# Patient Record
Sex: Female | Born: 1980 | Race: Black or African American | Hispanic: No | Marital: Married | Smoking: Never smoker
Health system: Southern US, Community
[De-identification: ages and names within clinical notes are randomized; demographics above are authoritative.]

## PROBLEM LIST (undated history)

## (undated) DIAGNOSIS — J45909 Unspecified asthma, uncomplicated: Secondary | ICD-10-CM

## (undated) DIAGNOSIS — F419 Anxiety disorder, unspecified: Secondary | ICD-10-CM

## (undated) DIAGNOSIS — N63 Unspecified lump in unspecified breast: Secondary | ICD-10-CM

## (undated) DIAGNOSIS — F329 Major depressive disorder, single episode, unspecified: Secondary | ICD-10-CM

## (undated) DIAGNOSIS — D573 Sickle-cell trait: Secondary | ICD-10-CM

## (undated) DIAGNOSIS — I10 Essential (primary) hypertension: Secondary | ICD-10-CM

## (undated) DIAGNOSIS — D72829 Elevated white blood cell count, unspecified: Secondary | ICD-10-CM

## (undated) DIAGNOSIS — L309 Dermatitis, unspecified: Secondary | ICD-10-CM

## (undated) DIAGNOSIS — Z8669 Personal history of other diseases of the nervous system and sense organs: Secondary | ICD-10-CM

## (undated) DIAGNOSIS — F32A Depression, unspecified: Secondary | ICD-10-CM

## (undated) DIAGNOSIS — IMO0001 Reserved for inherently not codable concepts without codable children: Secondary | ICD-10-CM

## (undated) DIAGNOSIS — Z8619 Personal history of other infectious and parasitic diseases: Secondary | ICD-10-CM

## (undated) HISTORY — DX: Essential (primary) hypertension: I10

## (undated) HISTORY — DX: Sickle-cell trait: D57.3

## (undated) HISTORY — DX: Dermatitis, unspecified: L30.9

## (undated) HISTORY — DX: Unspecified lump in unspecified breast: N63.0

## (undated) HISTORY — DX: Depression, unspecified: F32.A

## (undated) HISTORY — DX: Major depressive disorder, single episode, unspecified: F32.9

## (undated) HISTORY — DX: Personal history of other diseases of the nervous system and sense organs: Z86.69

## (undated) HISTORY — DX: Unspecified asthma, uncomplicated: J45.909

## (undated) HISTORY — DX: Anxiety disorder, unspecified: F41.9

## (undated) HISTORY — DX: Personal history of other infectious and parasitic diseases: Z86.19

---

## 2011-09-27 LAB — OB RESULTS CONSOLE RPR: RPR: NONREACTIVE

## 2011-09-27 LAB — OB RESULTS CONSOLE ABO/RH: RH Type: POSITIVE

## 2011-09-27 LAB — OB RESULTS CONSOLE HIV ANTIBODY (ROUTINE TESTING): HIV: NONREACTIVE

## 2011-10-12 NOTE — L&D Delivery Note (Signed)
Delivery Note At 6:31 PM a healthy female was delivered via Vaginal, Spontaneous Delivery (Presentation: Left Occiput Anterior).  APGAR: 9, 9.   Placenta status: Intact, Spontaneous.  Cord: 3 vessels with the following complications: None.  Cord pH: Not done  Anesthesia: Epidural  Episiotomy: None Lacerations: 1st degree Suture Repair: vicryl rapide Est. Blood Loss (mL): 300  Mom to postpartum.  Baby to nursery-stable.  Christia Coaxum,MARIE-LYNE 05/04/2012, 6:53 PM

## 2011-11-24 ENCOUNTER — Inpatient Hospital Stay (HOSPITAL_COMMUNITY): Admission: AD | Admit: 2011-11-24 | Payer: Self-pay | Source: Ambulatory Visit | Admitting: Obstetrics & Gynecology

## 2012-04-27 ENCOUNTER — Other Ambulatory Visit: Payer: Self-pay | Admitting: Obstetrics & Gynecology

## 2012-04-28 ENCOUNTER — Encounter (HOSPITAL_COMMUNITY): Payer: Self-pay | Admitting: *Deleted

## 2012-04-28 ENCOUNTER — Telehealth (HOSPITAL_COMMUNITY): Payer: Self-pay | Admitting: *Deleted

## 2012-04-28 NOTE — Telephone Encounter (Signed)
Preadmission screen  

## 2012-05-03 ENCOUNTER — Inpatient Hospital Stay (HOSPITAL_COMMUNITY)
Admission: AD | Admit: 2012-05-03 | Discharge: 2012-05-06 | DRG: 775 | Disposition: A | Discharge status: Home / Self Care | Payer: 59 | Source: Ambulatory Visit | Attending: Obstetrics and Gynecology | Admitting: Obstetrics and Gynecology

## 2012-05-03 DIAGNOSIS — D72829 Elevated white blood cell count, unspecified: Secondary | ICD-10-CM

## 2012-05-03 DIAGNOSIS — O99892 Other specified diseases and conditions complicating childbirth: Secondary | ICD-10-CM | POA: Diagnosis present

## 2012-05-03 DIAGNOSIS — IMO0001 Reserved for inherently not codable concepts without codable children: Secondary | ICD-10-CM

## 2012-05-03 DIAGNOSIS — Z22330 Carrier of Group B streptococcus: Secondary | ICD-10-CM

## 2012-05-03 HISTORY — DX: Elevated white blood cell count, unspecified: D72.829

## 2012-05-03 HISTORY — DX: Reserved for inherently not codable concepts without codable children: IMO0001

## 2012-05-03 NOTE — MAU Note (Signed)
Pt G1 at 40.3wks leaking clear fluid since 2245 and having contractions.  GBS +

## 2012-05-04 ENCOUNTER — Inpatient Hospital Stay (HOSPITAL_COMMUNITY): Payer: 59 | Admitting: Anesthesiology

## 2012-05-04 ENCOUNTER — Encounter (HOSPITAL_COMMUNITY): Payer: Self-pay | Admitting: Anesthesiology

## 2012-05-04 ENCOUNTER — Inpatient Hospital Stay (HOSPITAL_COMMUNITY): Admission: RE | Admit: 2012-05-04 | Payer: 59 | Source: Ambulatory Visit

## 2012-05-04 ENCOUNTER — Encounter (HOSPITAL_COMMUNITY): Payer: Self-pay | Admitting: *Deleted

## 2012-05-04 DIAGNOSIS — IMO0001 Reserved for inherently not codable concepts without codable children: Secondary | ICD-10-CM

## 2012-05-04 HISTORY — DX: Reserved for inherently not codable concepts without codable children: IMO0001

## 2012-05-04 LAB — CBC
MCV: 82.8 fL (ref 78.0–100.0)
Platelets: 185 10*3/uL (ref 150–400)
RBC: 4.82 MIL/uL (ref 3.87–5.11)
RDW: 20.2 % — ABNORMAL HIGH (ref 11.5–15.5)
WBC: 11.7 10*3/uL — ABNORMAL HIGH (ref 4.0–10.5)

## 2012-05-04 LAB — RPR: RPR Ser Ql: NONREACTIVE

## 2012-05-04 MED ORDER — PENICILLIN G POTASSIUM 5000000 UNITS IJ SOLR
5.0000 10*6.[IU] | Freq: Once | INTRAVENOUS | Status: AC
  Administered 2012-05-04: 5 10*6.[IU] via INTRAVENOUS
  Filled 2012-05-04: qty 5

## 2012-05-04 MED ORDER — ONDANSETRON HCL 4 MG/2ML IJ SOLN: 4.0000 mg | INTRAMUSCULAR | Status: DC | PRN

## 2012-05-04 MED ORDER — ZOLPIDEM TARTRATE 5 MG PO TABS: 5.0000 mg | ORAL_TABLET | Freq: Every evening | ORAL | Status: DC | PRN

## 2012-05-04 MED ORDER — EPHEDRINE 5 MG/ML INJ
10.0000 mg | INTRAVENOUS | Status: DC | PRN
  Filled 2012-05-04: qty 4

## 2012-05-04 MED ORDER — OXYTOCIN 40 UNITS IN LACTATED RINGERS INFUSION - SIMPLE MED
1.0000 m[IU]/min | INTRAVENOUS | Status: DC
  Administered 2012-05-04: 4 m[IU]/min via INTRAVENOUS
  Administered 2012-05-04: 8 m[IU]/min via INTRAVENOUS
  Administered 2012-05-04: 10 m[IU]/min via INTRAVENOUS
  Administered 2012-05-04: 2 m[IU]/min via INTRAVENOUS

## 2012-05-04 MED ORDER — TETANUS-DIPHTH-ACELL PERTUSSIS 5-2.5-18.5 LF-MCG/0.5 IM SUSP
0.5000 mL | Freq: Once | INTRAMUSCULAR | Status: AC
  Administered 2012-05-05: 0.5 mL via INTRAMUSCULAR
  Filled 2012-05-04: qty 0.5

## 2012-05-04 MED ORDER — LIDOCAINE HCL (PF) 1 % IJ SOLN
INTRAMUSCULAR | Status: DC | PRN
  Administered 2012-05-04 (×2): 5 mL

## 2012-05-04 MED ORDER — ONDANSETRON HCL 4 MG/2ML IJ SOLN: 4.0000 mg | Freq: Four times a day (QID) | INTRAMUSCULAR | Status: DC | PRN

## 2012-05-04 MED ORDER — LACTATED RINGERS IV SOLN
INTRAVENOUS | Status: DC
  Administered 2012-05-04 (×3): via INTRAVENOUS

## 2012-05-04 MED ORDER — FLEET ENEMA 7-19 GM/118ML RE ENEM: 1.0000 | ENEMA | RECTAL | Status: DC | PRN

## 2012-05-04 MED ORDER — PHENYLEPHRINE 40 MCG/ML (10ML) SYRINGE FOR IV PUSH (FOR BLOOD PRESSURE SUPPORT): 80.0000 ug | PREFILLED_SYRINGE | INTRAVENOUS | Status: DC | PRN

## 2012-05-04 MED ORDER — ACETAMINOPHEN 325 MG PO TABS: 650.0000 mg | ORAL_TABLET | ORAL | Status: DC | PRN

## 2012-05-04 MED ORDER — PRENATAL MULTIVITAMIN CH
1.0000 | ORAL_TABLET | Freq: Every day | ORAL | Status: DC
  Administered 2012-05-05 – 2012-05-06 (×2): 1 via ORAL
  Filled 2012-05-04 (×2): qty 1

## 2012-05-04 MED ORDER — DIPHENHYDRAMINE HCL 25 MG PO CAPS: 25.0000 mg | ORAL_CAPSULE | Freq: Four times a day (QID) | ORAL | Status: DC | PRN

## 2012-05-04 MED ORDER — LIDOCAINE HCL (PF) 1 % IJ SOLN
30.0000 mL | INTRAMUSCULAR | Status: DC | PRN
  Filled 2012-05-04: qty 30

## 2012-05-04 MED ORDER — OXYTOCIN BOLUS FROM INFUSION
250.0000 mL | Freq: Once | INTRAVENOUS | Status: AC
  Administered 2012-05-04: 250 mL via INTRAVENOUS
  Filled 2012-05-04: qty 500

## 2012-05-04 MED ORDER — TERBUTALINE SULFATE 1 MG/ML IJ SOLN: 0.2500 mg | Freq: Once | INTRAMUSCULAR | Status: DC | PRN

## 2012-05-04 MED ORDER — NALBUPHINE SYRINGE 5 MG/0.5 ML
10.0000 mg | INJECTION | INTRAMUSCULAR | Status: DC | PRN
  Administered 2012-05-04: 10 mg via INTRAVENOUS
  Filled 2012-05-04 (×2): qty 1

## 2012-05-04 MED ORDER — FENTANYL 2.5 MCG/ML BUPIVACAINE 1/10 % EPIDURAL INFUSION (WH - ANES)
14.0000 mL/h | INTRAMUSCULAR | Status: DC
  Administered 2012-05-04 (×3): 14 mL/h via EPIDURAL
  Filled 2012-05-04 (×3): qty 60

## 2012-05-04 MED ORDER — CITRIC ACID-SODIUM CITRATE 334-500 MG/5ML PO SOLN: 30.0000 mL | ORAL | Status: DC | PRN

## 2012-05-04 MED ORDER — SIMETHICONE 80 MG PO CHEW: 80.0000 mg | CHEWABLE_TABLET | ORAL | Status: DC | PRN

## 2012-05-04 MED ORDER — LANOLIN HYDROUS EX OINT: TOPICAL_OINTMENT | CUTANEOUS | Status: DC | PRN

## 2012-05-04 MED ORDER — EPHEDRINE 5 MG/ML INJ: 10.0000 mg | INTRAVENOUS | Status: DC | PRN

## 2012-05-04 MED ORDER — IBUPROFEN 600 MG PO TABS: 600.0000 mg | ORAL_TABLET | Freq: Four times a day (QID) | ORAL | Status: DC | PRN

## 2012-05-04 MED ORDER — IBUPROFEN 600 MG PO TABS
600.0000 mg | ORAL_TABLET | Freq: Four times a day (QID) | ORAL | Status: DC
  Administered 2012-05-04 – 2012-05-06 (×7): 600 mg via ORAL
  Filled 2012-05-04 (×7): qty 1

## 2012-05-04 MED ORDER — OXYTOCIN 40 UNITS IN LACTATED RINGERS INFUSION - SIMPLE MED
62.5000 mL/h | Freq: Once | INTRAVENOUS | Status: DC
  Filled 2012-05-04: qty 1000

## 2012-05-04 MED ORDER — LACTATED RINGERS IV SOLN
500.0000 mL | INTRAVENOUS | Status: DC | PRN
  Administered 2012-05-04: 500 mL via INTRAVENOUS

## 2012-05-04 MED ORDER — PENICILLIN G POTASSIUM 5000000 UNITS IJ SOLR
2.5000 10*6.[IU] | INTRAVENOUS | Status: DC
  Administered 2012-05-04 (×2): 2.5 10*6.[IU] via INTRAVENOUS
  Filled 2012-05-04 (×6): qty 2.5

## 2012-05-04 MED ORDER — LACTATED RINGERS IV SOLN: 500.0000 mL | Freq: Once | INTRAVENOUS | Status: DC

## 2012-05-04 MED ORDER — PHENYLEPHRINE 40 MCG/ML (10ML) SYRINGE FOR IV PUSH (FOR BLOOD PRESSURE SUPPORT)
80.0000 ug | PREFILLED_SYRINGE | INTRAVENOUS | Status: DC | PRN
  Filled 2012-05-04: qty 5

## 2012-05-04 MED ORDER — WITCH HAZEL-GLYCERIN EX PADS: 1.0000 "application " | MEDICATED_PAD | CUTANEOUS | Status: DC | PRN

## 2012-05-04 MED ORDER — OXYCODONE-ACETAMINOPHEN 5-325 MG PO TABS: 1.0000 | ORAL_TABLET | ORAL | Status: DC | PRN

## 2012-05-04 MED ORDER — SENNOSIDES-DOCUSATE SODIUM 8.6-50 MG PO TABS
2.0000 | ORAL_TABLET | Freq: Every day | ORAL | Status: DC
  Administered 2012-05-05: 2 via ORAL

## 2012-05-04 MED ORDER — ONDANSETRON HCL 4 MG PO TABS: 4.0000 mg | ORAL_TABLET | ORAL | Status: DC | PRN

## 2012-05-04 MED ORDER — BENZOCAINE-MENTHOL 20-0.5 % EX AERO: 1.0000 "application " | INHALATION_SPRAY | CUTANEOUS | Status: DC | PRN

## 2012-05-04 MED ORDER — DIBUCAINE 1 % RE OINT: 1.0000 "application " | TOPICAL_OINTMENT | RECTAL | Status: DC | PRN

## 2012-05-04 MED ORDER — DIPHENHYDRAMINE HCL 50 MG/ML IJ SOLN: 12.5000 mg | INTRAMUSCULAR | Status: DC | PRN

## 2012-05-04 NOTE — Progress Notes (Signed)
Pt transferred to rm 146 via wheelchair, fob at bedside

## 2012-05-04 NOTE — Anesthesia Preprocedure Evaluation (Signed)
Anesthesia Evaluation  Patient identified by MRN, date of birth, ID band Patient awake    Reviewed: Allergy & Precautions, H&P , Patient's Chart, lab work & pertinent test results  Airway Mallampati: II TM Distance: >3 FB Neck ROM: full    Dental No notable dental hx.    Pulmonary neg pulmonary ROS, asthma ,  breath sounds clear to auscultation  Pulmonary exam normal       Cardiovascular negative cardio ROS  Rhythm:regular Rate:Normal     Neuro/Psych negative neurological ROS  negative psych ROS   GI/Hepatic negative GI ROS, Neg liver ROS,   Endo/Other  negative endocrine ROS  Renal/GU negative Renal ROS     Musculoskeletal   Abdominal   Peds  Hematology negative hematology ROS (+)   Anesthesia Other Findings   Reproductive/Obstetrics (+) Pregnancy                           Anesthesia Physical Anesthesia Plan  ASA: II  Anesthesia Plan: Epidural   Post-op Pain Management:    Induction:   Airway Management Planned:   Additional Equipment:   Intra-op Plan:   Post-operative Plan:   Informed Consent: I have reviewed the patients History and Physical, chart, labs and discussed the procedure including the risks, benefits and alternatives for the proposed anesthesia with the patient or authorized representative who has indicated his/her understanding and acceptance.     Plan Discussed with:   Anesthesia Plan Comments:         Anesthesia Quick Evaluation  

## 2012-05-04 NOTE — Progress Notes (Signed)
Pt requested SVE to see if she is ready for epidural. Cervix is virtually unchanged.

## 2012-05-04 NOTE — Anesthesia Procedure Notes (Signed)
Epidural Patient location during procedure: OB Start time: 05/04/2012 9:58 AM  Staffing Anesthesiologist: Brayton Caves R Performed by: anesthesiologist   Preanesthetic Checklist Completed: patient identified, site marked, surgical consent, pre-op evaluation, timeout performed, IV checked, risks and benefits discussed and monitors and equipment checked  Epidural Patient position: sitting Prep: site prepped and draped and DuraPrep Patient monitoring: continuous pulse ox and blood pressure Approach: midline Injection technique: LOR air and LOR saline  Needle:  Needle type: Tuohy  Needle gauge: 17 G Needle length: 9 cm Needle insertion depth: 4 cm Catheter type: closed end flexible Catheter size: 19 Gauge Catheter at skin depth: 10 cm Test dose: negative  Assessment Events: blood not aspirated, injection not painful, no injection resistance, negative IV test and no paresthesia  Additional Notes Patient identified.  Risk benefits discussed including failed block, incomplete pain control, headache, nerve damage, paralysis, blood pressure changes, nausea, vomiting, reactions to medication both toxic or allergic, and postpartum back pain.  Patient expressed understanding and wished to proceed.  All questions were answered.  Sterile technique used throughout procedure and epidural site dressed with sterile barrier dressing. No paresthesia or other complications noted.The patient did not experience any signs of intravascular injection such as tinnitus or metallic taste in mouth nor signs of intrathecal spread such as rapid motor block. Please see nursing notes for vital signs.

## 2012-05-04 NOTE — Progress Notes (Signed)
Subjective: Doing well, pain controled with epidural, UCs q36mins  Anesthesia Epidural   Objective: BP 143/98  Pulse 119  Temp 98.3 F (36.8 C) (Oral)  Resp 20  Ht 5\' 2"  (1.575 m)  Wt 62.415 kg (137 lb 9.6 oz)  BMI 25.17 kg/m2  SpO2 99%  LMP 07/25/2011   FHT:  140's BLine, accelerations pos, good variability, variable decelerations when on left side, resolved on right side. UC:   regular, every 3 minutes VE:   Dilation: 5 Effacement (%): 100 Station: 0 Exam by:: Seymour Bars, MD   Assessment / Plan: Augmentation of labor, progressing well F-well-being reassuring.  Fetal Wellbeing:  Category I Pain Control:  Epidural  Anticipated MOD:  NSVD  Janaia Kozel,MARIE-LYNE 05/04/2012, 1:23 PM

## 2012-05-04 NOTE — H&P (Signed)
Madison Huang is a 31 y.o. female presenting for admission 2nd to [facility]. Pt is 40 4/[redacted] weeks gestation and was scheduled for induction 7/25. GBS cx (+)  SS trait. Maternal Medical History:  Reason for admission: Reason for admission: rupture of membranes and contractions.  Contractions: Frequency: irregular.   Perceived severity is mild.    Fetal activity: Perceived fetal activity is normal.    Prenatal Complications - Diabetes: none.    OB History    Grav Para Term Preterm Abortions TAB SAB Ect Mult Living   1              Past Medical History  Diagnosis Date  . Sickle cell trait   . H/O varicella   . Asthma     as a child not since college   History reviewed. No pertinent past surgical history. Family History: family history includes Cancer in her maternal aunt and mother; Diabetes in her father, maternal grandmother, and paternal grandfather; Hypertension in her father; and Stroke in her mother. Social History:  reports that she has never smoked. She has never used smokeless tobacco. She reports that she does not drink alcohol or use illicit drugs.   Prenatal Transfer Tool  Maternal Diabetes: No Genetic Screening: Normal Maternal Ultrasounds/Referrals: Normal Fetal Ultrasounds or other Referrals:  None Maternal Substance Abuse:  No Significant Maternal Medications:  None Significant Maternal Lab Results:  Lab values include: Group B Strep positive, Other: see prenatal record Other Comments:  SS trait  ROS neg  Dilation: 1 Effacement (%): 70 Station: -2;-3 Exam by:: Dr. Cherly Hensen Blood pressure 129/75, pulse 78, temperature 98.9 F (37.2 C), temperature source Oral, resp. rate 18, height 5\' 2"  (1.575 m), weight 62.415 kg (137 lb 9.6 oz), last menstrual period 07/25/2011. Exam Physical Exam  Constitutional: She is oriented to person, place, and time. She appears well-developed and well-nourished.  Eyes: EOM are normal.  Neck: Neck supple.  Cardiovascular: Regular  rhythm.   Respiratory: Effort normal and breath sounds normal.  GI: Soft. Bowel sounds are normal.  Musculoskeletal: Normal range of motion.  Neurological: She is alert and oriented to person, place, and time.  Skin: Skin is warm and dry.  Psychiatric: She has a normal mood and affect.    Prenatal labs: ABO, Rh: O/Positive/-- (12/17 0000) Antibody: Negative (12/17 0000) Rubella: Immune (12/17 0000) RPR: Nonreactive (12/17 0000)  HBsAg: Negative (12/17 0000)  HIV: Non-reactive (12/17 0000)  GBS: Positive (06/19 0000)   Assessment/Plan  Postdates [facility] GBS cx(+)  P) Admit. Routine labs. Analgesic prn. Pitocin augmentation Uday Jantz A 05/04/2012, 4:15 AM

## 2012-05-04 NOTE — Progress Notes (Signed)
Subjective: Doing well, UCs q3 mins,  Anesthesia epidural   Objective: BP 108/77  Pulse 102  Temp 98.1 F (36.7 C) (Oral)  Resp 18  Ht 5\' 2"  (1.575 m)  Wt 62.415 kg (137 lb 9.6 oz)  BMI 25.17 kg/m2  SpO2 99%  LMP 07/25/2011   FHT:  140's with good variability, occasional variable decelerations. UC:   regular, every 3 minutes VE:   Dilation: 7 Effacement (%): 90 Station: 0 Exam by:: Montez Hageman, RN   VE now: 9+ cm/+1 station.  Left occiput ant. With mild asynclitism corrected with digital manipulation.  Assessment / Plan: Augmentation of labor, progressing well  Fetal Wellbeing:  Category I Pain Control:  epidural  Anticipated MOD:  NSVD  Leroy Pettway,MARIE-LYNE 05/04/2012, 3:55 PM

## 2012-05-05 ENCOUNTER — Encounter (HOSPITAL_COMMUNITY): Payer: Self-pay | Admitting: Obstetrics and Gynecology

## 2012-05-05 LAB — CBC
Hemoglobin: 12.6 g/dL (ref 12.0–15.0)
MCH: 27.7 pg (ref 26.0–34.0)
MCHC: 33.2 g/dL (ref 30.0–36.0)

## 2012-05-05 NOTE — Anesthesia Postprocedure Evaluation (Signed)
  Anesthesia Post-op Note  Patient: Madison Huang  Procedure(s) Performed: * No procedures listed *  Patient Location: PACU and Mother/Baby  Anesthesia Type: Epidural  Level of Consciousness: awake, alert  and oriented  Airway and Oxygen Therapy: Patient Spontanous Breathing  Post-op Pain: mild  Post-op Assessment: Post-op Vital signs reviewed  Post-op Vital Signs: Reviewed and stable  Complications: No apparent anesthesia complications

## 2012-05-05 NOTE — Progress Notes (Signed)
Patient ID: Madison Huang, female   DOB: 1981-08-21, 31 y.o.   MRN: [id number] PPD # 1  Subjective: Pt reports feeling well; mod soreness/ Pain controlled with ibuprofen Tolerating po/ Voiding without problems/ No n/v Bleeding is moderate Newborn info:  Information for the patient's newborn:  [name], Ebbert [[id number]]  female  / circ today/ Feeding: breast   Objective:  VS: Blood pressure 96/67, pulse 106, temperature 98 F (36.7 C), temperature source Oral, resp. rate 18.    Basename 05/05/12 0525 05/04/12 0025  WBC 25.6* 11.7*  HGB 12.6 13.3  HCT 38.0 39.9  PLT 181 185    Blood type: --/--/O POS (07/26 0500) Rubella: Immune (12/17 0000)    Physical Exam:  General: A & O x 3  alert, cooperative and no distress CV: Regular rate and rhythm Resp: clear Abdomen: soft, nontender, normal bowel sounds Uterine Fundus: firm, below umbilicus, nontender Perineum: not inspected and mom breast feeding Ext: edema none and Homans sign is negative, no sign of DVT   A/P: PPD # 1/ G1P1001/ S/P:spontaneous vaginal delivery w/ 1st deg repair Doing well Continue routine post partum orders Anticipate D/C home in AM    Demetrius Revel, MSN, [facility] 05/05/2012, 9:46 AM

## 2012-05-06 ENCOUNTER — Encounter (HOSPITAL_COMMUNITY): Payer: Self-pay

## 2012-05-06 DIAGNOSIS — D72829 Elevated white blood cell count, unspecified: Secondary | ICD-10-CM

## 2012-05-06 HISTORY — DX: Elevated white blood cell count, unspecified: D72.829

## 2012-05-06 LAB — CBC
HCT: 32.4 % — ABNORMAL LOW (ref 36.0–46.0)
Hemoglobin: 10.9 g/dL — ABNORMAL LOW (ref 12.0–15.0)
MCH: 28.2 pg (ref 26.0–34.0)
MCHC: 33.6 g/dL (ref 30.0–36.0)
MCV: 83.9 fL (ref 78.0–100.0)
RBC: 3.86 MIL/uL — ABNORMAL LOW (ref 3.87–5.11)

## 2012-05-06 MED ORDER — IBUPROFEN 600 MG PO TABS: 600.0000 mg | ORAL_TABLET | Freq: Four times a day (QID) | ORAL | Status: AC

## 2012-05-06 MED ORDER — BREAST PUMP MISC: 1.0000 [IU] | Status: DC | PRN

## 2012-05-06 NOTE — Progress Notes (Signed)
Post Partum Day #2            Information for the patient's newborn:  Asyria, Kolander [[id number]]  female   / circumcision done Feeding: breast, working w/ [facility]  Subjective: No HA, SOB, CP, F/C, breast symptoms. Pain minimal. Normal vaginal bleeding, no clots.    Denies fever / chills / foul lochia odor, minimal cramping   Objective:  Temp:  [98.1 F (36.7 C)-99.3 F (37.4 C)] 98.1 F (36.7 C) (07/27 0535) Pulse Rate:  [67-94] 67  (07/27 0535) Resp:  [18] 18  (07/27 0535) BP: (96-115)/(67-75) 115/75 mmHg (07/27 0535)  No intake or output data in the 24 hours ending 05/06/12 1024     Basename 05/06/12 1055 05/05/12 0525  WBC 18.8* 25.6*  HGB 10.9* 12.6  HCT 32.4* 38.0  PLT 159 181    Blood type: --/--/O POS (07/26 0500) Rubella: Immune (12/17 0000)    Physical Exam:  General: alert, cooperative and no distress Uterine Fundus: firm, U-1 Lochia: appropriate Perineum: repair intact, edema none DVT Evaluation: Negative Homan's sign. No significant calf/ankle edema.    Assessment/Plan: PPD # 2 / 31 y.o., G1P1001 S/P:spontaneous vaginal   Principal Problem:  *SVD 05/04/12 M Active Problems:  Leukocytosis  No evidence of endometriosis, afebrile (Tmax 99.3), no uterine tenderness, no foul lochia  Rpt CBC, WBC improved to 18    normal postpartum exam  Continue current postpartum care  D/C home w/ infection precautions  Consult Dr. Billy Coast   LOS: 3 days   Arlan Organ, CNM, MSN 05/06/2012, 10:24 AM

## 2012-05-06 NOTE — Discharge Summary (Signed)
Obstetric Discharge Summary Reason for Admission: onset of labor Prenatal Procedures: ultrasound Intrapartum Procedures: spontaneous vaginal delivery Postpartum Procedures: TDaP vaccine Complications-Operative and Postpartum: 1st degree perineal laceration  Results for Madison Huang, Madison Huang (MRN [id number]) as of 05/06/2012 11:23  05/04/2012 00:25 05/05/2012 05:00 05/05/2012 05:25 05/06/2012 10:55  WBC 11.7 (H)  25.6 (H) 18.8 (H)  RBC 4.82  4.55 3.86 (L)  Hemoglobin 13.3  12.6 10.9 (L)  HCT 39.9  38.0 32.4 (L)  MCV 82.8  83.5 83.9  MCH 27.6  27.7 28.2  MCHC 33.3  33.2 33.6  RDW 20.2 (H)  20.4 (H) 20.0 (H)  Platelets 185  181 159   Physical Exam:  General: alert, cooperative and no distress Lochia: appropriate Uterine Fundus: firm Incision: healing well DVT Evaluation: Negative Homan's sign. No significant calf/ankle edema.  Discharge Diagnoses: Term Pregnancy-delivered and Leokocytosis, afebrile, no evidence of endometritis on exam  Discharge Information: Date: 05/06/2012 Activity: pelvic rest Diet: routine Medications: PNV and Ibuprofen Condition: stable Instructions: refer to practice specific booklet Discharge to: home Follow-up Information    Follow up with LAVOIE,MARIE-LYNE, MD. Schedule an appointment as soon as possible for a visit in 6 weeks.   Contact information:   [address] [place] [address] [phone]          Newborn Data: Live born female "Sheria Lang" Birth Weight: 7 lb 15 oz (3600 g) APGAR: 9, 9  Home with mother.  Madison Huang 05/06/2012, 10:47 AM

## 2014-08-12 ENCOUNTER — Encounter (HOSPITAL_COMMUNITY): Payer: Self-pay

## 2016-11-30 ENCOUNTER — Encounter: Payer: Self-pay | Admitting: Obstetrics and Gynecology

## 2017-10-11 DIAGNOSIS — I10 Essential (primary) hypertension: Secondary | ICD-10-CM

## 2017-10-11 HISTORY — DX: Essential (primary) hypertension: I10

## 2018-05-19 DIAGNOSIS — R030 Elevated blood-pressure reading, without diagnosis of hypertension: Secondary | ICD-10-CM | POA: Diagnosis not present

## 2018-05-19 DIAGNOSIS — Z3009 Encounter for other general counseling and advice on contraception: Secondary | ICD-10-CM | POA: Diagnosis not present

## 2018-05-19 DIAGNOSIS — Z8669 Personal history of other diseases of the nervous system and sense organs: Secondary | ICD-10-CM | POA: Diagnosis not present

## 2018-07-19 DIAGNOSIS — Z0000 Encounter for general adult medical examination without abnormal findings: Secondary | ICD-10-CM | POA: Diagnosis not present

## 2018-07-26 DIAGNOSIS — Z0130 Encounter for examination of blood pressure without abnormal findings: Secondary | ICD-10-CM | POA: Diagnosis not present

## 2018-07-27 DIAGNOSIS — Z0130 Encounter for examination of blood pressure without abnormal findings: Secondary | ICD-10-CM | POA: Diagnosis not present

## 2018-07-28 DIAGNOSIS — Z0130 Encounter for examination of blood pressure without abnormal findings: Secondary | ICD-10-CM | POA: Diagnosis not present

## 2018-07-31 DIAGNOSIS — Z0130 Encounter for examination of blood pressure without abnormal findings: Secondary | ICD-10-CM | POA: Diagnosis not present

## 2018-08-03 DIAGNOSIS — Z0130 Encounter for examination of blood pressure without abnormal findings: Secondary | ICD-10-CM | POA: Diagnosis not present

## 2018-08-03 DIAGNOSIS — I10 Essential (primary) hypertension: Secondary | ICD-10-CM | POA: Diagnosis not present

## 2018-08-03 DIAGNOSIS — J309 Allergic rhinitis, unspecified: Secondary | ICD-10-CM | POA: Diagnosis not present

## 2018-08-04 DIAGNOSIS — Z0130 Encounter for examination of blood pressure without abnormal findings: Secondary | ICD-10-CM | POA: Diagnosis not present

## 2018-08-07 DIAGNOSIS — Z0130 Encounter for examination of blood pressure without abnormal findings: Secondary | ICD-10-CM | POA: Diagnosis not present

## 2018-08-08 DIAGNOSIS — Z0130 Encounter for examination of blood pressure without abnormal findings: Secondary | ICD-10-CM | POA: Diagnosis not present

## 2018-08-10 DIAGNOSIS — Z0130 Encounter for examination of blood pressure without abnormal findings: Secondary | ICD-10-CM | POA: Diagnosis not present

## 2018-08-14 DIAGNOSIS — Z0130 Encounter for examination of blood pressure without abnormal findings: Secondary | ICD-10-CM | POA: Diagnosis not present

## 2018-08-21 ENCOUNTER — Encounter: Payer: Self-pay | Admitting: Neurology

## 2018-08-21 ENCOUNTER — Ambulatory Visit: Payer: BLUE CROSS/BLUE SHIELD | Admitting: Neurology

## 2018-08-21 VITALS — BP 126/80 | HR 87 | Ht 63.0 in | Wt 122.0 lb

## 2018-08-21 DIAGNOSIS — R51 Headache with orthostatic component, not elsewhere classified: Secondary | ICD-10-CM

## 2018-08-21 DIAGNOSIS — H53133 Sudden visual loss, bilateral: Secondary | ICD-10-CM | POA: Diagnosis not present

## 2018-08-21 DIAGNOSIS — G43109 Migraine with aura, not intractable, without status migrainosus: Secondary | ICD-10-CM | POA: Diagnosis not present

## 2018-08-21 DIAGNOSIS — G43009 Migraine without aura, not intractable, without status migrainosus: Secondary | ICD-10-CM | POA: Diagnosis not present

## 2018-08-21 DIAGNOSIS — H532 Diplopia: Secondary | ICD-10-CM

## 2018-08-21 MED ORDER — RIZATRIPTAN BENZOATE 10 MG PO TBDP: 10.0000 mg | ORAL_TABLET | ORAL | 11 refills | Status: DC | PRN

## 2018-08-21 MED ORDER — ONDANSETRON 4 MG PO TBDP: 4.0000 mg | ORAL_TABLET | Freq: Three times a day (TID) | ORAL | 3 refills | Status: DC | PRN

## 2018-08-21 NOTE — Progress Notes (Signed)
[facility]    Provider:  Dr Lucia Gaskins Referring Provider: Arlana Lindau, NP Primary Care Physician:  Arlana Lindau, NP  CC:  Migraines  HPI:  Madison Huang is a 37 y.o. female here as requested by Dr. Sherrie Mustache for headaches and vision changes ongoing for 26 years.  Past medical history of elevated blood pressure, eczema, childhood asthma, migraine, depression, anxiety. Patient has had headaches since a teenager, the first time she had an aura she couldn't see anything like a flashlight was in her eye, she had nausea and vomiting. She continued to have migraines but didn't know what it was until college. She loses vision.  She does not always get the head pain after the aura. She does not always have an aura. The pain is behind the eyes, pulsating/pounding/throbbing, +photo/phonophobia, +nausea and vomiting, a dark room helps, movement makes it worse as does position.   She can go 2-3 months and not have a migraine. Aura is in both eyes. But she can have multiple migraines in a week. Can last 24 hours and be moderately severe to severe. Tylenol will help.  The aura is becoming more severe, losing vision, more frequent, vision blurry or loss of vision completely where she can't see part of her vision, blurry to the point she can't drive she has to pull over. Recently more stress, in the setting of stress. She gets aura 1x a week for a few hours. Mother has migraine.  Reviewed notes, labs and imaging from outside physicians, which showed:  Reviewed provider notes Arlana Lindau, patient was seen by her gynecologist due to migraine with aura was discussing contraception.  Patient says she has been afraid to reveal symptoms that she felt she would be labeled as making it up.  Complaining of blurred vision, photophobia, tunneling of vision with scotoma, nausea with occasional vomiting and stabbing pain behind the eyes.  Headaches tend to occur in clusters a few times a week and then may be  only once per month.  Also complaining of fatigue and swishing sound in the ears.  Slight elevation in blood pressure which is occurred previously but no work-up or medications.  Exam was only significant for rapid heart rate otherwise physical normal.  She was referred here for management of her headaches/migraines.  Will request records of her recent labs that were drawn  Review of Systems: Patient complains of symptoms per HPI as well as the following symptoms: Fatigue, blurred vision, eye pain, anemia, feeling hot, increased thirst, palpitations, allergies, headache, dizziness, depression, anxiety, not enough sleep, decreased energy, insomnia, sleepiness. Pertinent negatives and positives per HPI. All others negative.   Social History   Socioeconomic History  . Marital status: Married    Spouse name: Not on file  . Number of children: 1  . Years of education: Not on file  . Highest education level: Some college, no degree  Occupational History    Employer: [employer]  Social Needs  . Financial resource strain: Not on file  . Food insecurity:    Worry: Not on file    Inability: Not on file  . Transportation needs:    Medical: Not on file    Non-medical: Not on file  Tobacco Use  . Smoking status: Never Smoker  . Smokeless tobacco: Never Used  Substance and Sexual Activity  . Alcohol use: No    Comment: quit August 2011  . Drug use: Never  . Sexual activity: Yes    Partners: Male  Birth control/protection: Pill  Lifestyle  . Physical activity:    Days per week: Not on file    Minutes per session: Not on file  . Stress: Not on file  Relationships  . Social connections:    Talks on phone: Not on file    Gets together: Not on file    Attends religious service: Not on file    Active member of club or organization: Not on file    Attends meetings of clubs or organizations: Not on file    Relationship status: Not on file  . Intimate partner violence:    Fear of current or  ex partner: Not on file    Emotionally abused: Not on file    Physically abused: Not on file    Forced sexual activity: Not on file  Other Topics Concern  . Not on file  Social History Narrative   Lives at home with husband and son   Right handed   Caffeine: 2x per month     Family History  Problem Relation Age of Onset  . Diabetes Maternal Grandmother   . Stroke Mother   . Cancer Mother        breast  . Migraines Mother        on preventatives   . Diabetes Father   . Hypertension Father   . Diabetes Paternal Grandfather   . Cancer Maternal Aunt        uterine    Past Medical History:  Diagnosis Date  . Active labor 05/04/2012  . Anxiety   . Asthma    as a child not since college  . Breast lump   . Depression   . Eczema   . H/O migraine with aura   . H/O varicella   . High blood pressure 2019   on Lisinopril  . Leukocytosis 05/06/2012  . Sickle cell trait (HCC)   . SVD 05/04/12 M 05/05/2012    History reviewed. No pertinent surgical history.  Current Outpatient Medications  Medication Sig Dispense Refill  . levocetirizine (XYZAL) 5 MG tablet Take 5 mg by mouth every evening.  0  . lisinopril (PRINIVIL,ZESTRIL) 10 MG tablet Take 10 mg by mouth daily.    . montelukast (SINGULAIR) 10 MG tablet Take 10 mg by mouth at bedtime.   0  . norethindrone (ERRIN) 0.35 MG tablet Take 1 tablet by mouth daily.    . ondansetron (ZOFRAN-ODT) 4 MG disintegrating tablet Take 1 tablet (4 mg total) by mouth every 8 (eight) hours as needed for nausea. 30 tablet 3  . rizatriptan (MAXALT-MLT) 10 MG disintegrating tablet Take 1 tablet (10 mg total) by mouth as needed for migraine. May repeat in 2 hours if needed 9 tablet 11   No current facility-administered medications for this visit.     Allergies as of 08/21/2018  . (No Known Allergies)    Vitals: BP 126/80 (BP Location: Right Arm, Patient Position: Sitting)   Pulse 87   Ht 5\' 3"  (1.6 m)   Wt 122 lb (55.3 kg)   LMP 07/29/2018  (Exact Date)   Breastfeeding? No   BMI 21.61 kg/m  Last Weight:  Wt Readings from Last 1 Encounters:  08/21/18 122 lb (55.3 kg)   Last Height:   Ht Readings from Last 1 Encounters:  08/21/18 5\' 3"  (1.6 m)   Physical exam: Exam: Gen: NAD, conversant, well nourised, well groomed  CV: RRR, no MRG. No Carotid Bruits. No peripheral edema, warm, nontender Eyes: Conjunctivae clear without exudates or hemorrhage  Neuro: Detailed Neurologic Exam  Speech:    Speech is normal; fluent and spontaneous with normal comprehension.  Cognition:    The patient is oriented to person, place, and time;     recent and remote memory intact;     language fluent;     normal attention, concentration,     fund of knowledge Cranial Nerves:    The pupils are equal, round, and reactive to light. The fundi are normal and spontaneous venous pulsations are present. Visual fields are full to finger confrontation. Extraocular movements are intact. Trigeminal sensation is intact and the muscles of mastication are normal. The face is symmetric. The palate elevates in the midline. Hearing intact. Voice is normal. Shoulder shrug is normal. The tongue has normal motion without fasciculations.   Coordination:    Normal finger to nose and heel to shin. Normal rapid alternating movements.   Gait:    Heel-toe and tandem gait are normal.   Motor Observation:    No asymmetry, no atrophy, and no involuntary movements noted. Tone:    Normal muscle tone.    Posture:    Posture is normal. normal erect    Strength:    Strength is V/V in the upper and lower limbs.      Sensation: intact to LT     Reflex Exam:  DTR's:    Deep tendon reflexes in the upper and lower extremities are normal bilaterally.   Toes:    The toes are downgoing bilaterally.   Clonus:    Clonus is absent.       Assessment/Plan:   37 year old with and without migraine however given concerning symptoms needs further  evaluation  MRI of the brain w/wo contrast: MRI brain due to concerning symptoms of vision loss, positional headaches,vision changes  to look for space occupying mass, chiari or intracranial hypertension (pseudotumor), MS, stroke.  Magnesium citrate 400mg  to 600mg  daily as this has evidence in Migraine with aura  Also riboflavin 400mg  daily, Coenzyme Q 10 100mg  three times daily  Acute Management: Rizatriptan. Please take one tablet at the onset of your headache. If it does not improve the symptoms please take one additional tablet. Do not take more then 2 tablets in 24hrs. Do not take use more then 2 to 3 times in a week.  For nausea and migraine: Ondansetron (Zofran)  Orders Placed This Encounter  Procedures  . MR BRAIN W WO CONTRAST   Meds ordered this encounter  Medications  . rizatriptan (MAXALT-MLT) 10 MG disintegrating tablet    Sig: Take 1 tablet (10 mg total) by mouth as needed for migraine. May repeat in 2 hours if needed    Dispense:  9 tablet    Refill:  11  . ondansetron (ZOFRAN-ODT) 4 MG disintegrating tablet    Sig: Take 1 tablet (4 mg total) by mouth every 8 (eight) hours as needed for nausea.    Dispense:  30 tablet    Refill:  3     Discussed: To prevent or relieve headaches, try the following: Cool Compress. Lie down and place a cool compress on your head.  Avoid headache triggers. If certain foods or odors seem to have triggered your migraines in the past, avoid them. A headache diary might help you identify triggers.  Include physical activity in your daily routine. Try a daily walk or other moderate  aerobic exercise.  Manage stress. Find healthy ways to cope with the stressors, such as delegating tasks on your to-do list.  Practice relaxation techniques. Try deep breathing, yoga, massage and visualization.  Eat regularly. Eating regularly scheduled meals and maintaining a healthy diet might help prevent headaches. Also, drink plenty of fluids.  Follow a  regular sleep schedule. Sleep deprivation might contribute to headaches Consider biofeedback. With this mind-body technique, you learn to control certain bodily functions - such as muscle tension, heart rate and blood pressure - to prevent headaches or reduce headache pain.    Proceed to emergency room if you experience new or worsening symptoms or symptoms do not resolve, if you have new neurologic symptoms or if headache is severe, or for any concerning symptom.   Provided education and documentation from American headache Society toolbox including articles on: chronic migraine medication overuse headache, chronic migraines, prevention of migraines, behavioral and other nonpharmacologic treatments for headache.  Cc: Arlana Lindau, NP  Naomie Dean, MD  [facility] [address] [place], [place] [address]  Phone [phone] Fax [phone]

## 2018-08-21 NOTE — Patient Instructions (Addendum)
Magnesium citrate 400mg  to 600mg  daily as this has evidence in Migraine with aura  Also riboflavin 400mg  daily, Coenzyme Q 10 100mg  three times daily  Acute Management: Rizatriptan. Please take one tablet at the onset of your headache. If it does not improve the symptoms please take one additional tablet. Do not take more then 2 tablets in 24hrs. Do not take use more then 2 to 3 times in a week.  For nausea and migraine: Ondansetron (Zofran)  MRI if the brain (we will call you)   Migraine Headache A migraine headache is a very strong throbbing pain on one side or both sides of your head. Migraines can also cause other symptoms. Talk with your doctor about what things may bring on (trigger) your migraine headaches. Follow these instructions at home: Medicines  Take over-the-counter and prescription medicines only as told by your doctor.  Do not drive or use heavy machinery while taking prescription pain medicine.  To prevent or treat constipation while you are taking prescription pain medicine, your doctor may recommend that you: ? Drink enough fluid to keep your pee (urine) clear or pale yellow. ? Take over-the-counter or prescription medicines. ? Eat foods that are high in fiber. These include fresh fruits and vegetables, whole grains, and beans. ? Limit foods that are high in fat and processed sugars. These include fried and sweet foods. Lifestyle  Avoid alcohol.  Do not use any products that contain nicotine or tobacco, such as cigarettes and e-cigarettes. If you need help quitting, ask your doctor.  Get at least 8 hours of sleep every night.  Limit your stress. General instructions   Keep a journal to find out what may bring on your migraines. For example, write down: ? What you eat and drink. ? How much sleep you get. ? Any change in what you eat or drink. ? Any change in your medicines.  If you have a migraine: ? Avoid things that make your symptoms worse, such as  bright lights. ? It may help to lie down in a dark, quiet room. ? Do not drive or use heavy machinery. ? Ask your doctor what activities are safe for you.  Keep all follow-up visits as told by your doctor. This is important. Contact a doctor if:  You get a migraine that is different or worse than your usual migraines. Get help right away if:  Your migraine gets very bad.  You have a fever.  You have a stiff neck.  You have trouble seeing.  Your muscles feel weak or like you cannot control them.  You start to lose your balance a lot.  You start to have trouble walking.  You pass out (faint). This information is not intended to replace advice given to you by your health care provider. Make sure you discuss any questions you have with your health care provider. Document Released: 07/06/2008 Document Revised: 04/16/2016 Document Reviewed: 03/15/2016 Elsevier Interactive Patient Education  2018 [employer]  Ondansetron oral dissolving tablet What is this medicine? ONDANSETRON (on DAN se tron) is used to treat nausea and vomiting caused by chemotherapy. It is also used to prevent or treat nausea and vomiting after surgery. This medicine may be used for other purposes; ask your health care provider or pharmacist if you have questions. COMMON BRAND NAME(S): Zofran ODT What should I tell my health care provider before I take this medicine? They need to know if you have any of these conditions: -heart disease -history of irregular  heartbeat -liver disease -low levels of magnesium or potassium in the blood -an unusual or allergic reaction to ondansetron, granisetron, other medicines, foods, dyes, or preservatives -pregnant or trying to get pregnant -breast-feeding How should I use this medicine? These tablets are made to dissolve in the mouth. Do not try to push the tablet through the foil backing. With dry hands, peel away the foil backing and gently remove the tablet. Place the  tablet in the mouth and allow it to dissolve, then swallow. While you may take these tablets with water, it is not necessary to do so. Talk to your pediatrician regarding the use of this medicine in children. Special care may be needed. Overdosage: If you think you have taken too much of this medicine contact a poison control center or emergency room at once. NOTE: This medicine is only for you. Do not share this medicine with others. What if I miss a dose? If you miss a dose, take it as soon as you can. If it is almost time for your next dose, take only that dose. Do not take double or extra doses. What may interact with this medicine? Do not take this medicine with any of the following medications: -apomorphine -certain medicines for fungal infections like fluconazole, itraconazole, ketoconazole, posaconazole, voriconazole -cisapride -dofetilide -dronedarone -pimozide -thioridazine -ziprasidone This medicine may also interact with the following medications: -carbamazepine -certain medicines for depression, anxiety, or psychotic disturbances -fentanyl -linezolid -MAOIs like Carbex, Eldepryl, Marplan, Nardil, and Parnate -methylene blue (injected into a vein) -other medicines that prolong the QT interval (cause an abnormal heart rhythm) -phenytoin -rifampicin -tramadol This list may not describe all possible interactions. Give your health care provider a list of all the medicines, herbs, non-prescription drugs, or dietary supplements you use. Also tell them if you smoke, drink alcohol, or use illegal drugs. Some items may interact with your medicine. What should I watch for while using this medicine? Check with your doctor or health care professional as soon as you can if you have any sign of an allergic reaction. What side effects may I notice from receiving this medicine? Side effects that you should report to your doctor or health care professional as soon as possible: -allergic  reactions like skin rash, itching or hives, swelling of the face, lips, or tongue -breathing problems -confusion -dizziness -fast or irregular heartbeat -feeling faint or lightheaded, falls -fever and chills -loss of balance or coordination -seizures -sweating -swelling of the hands and feet -tightness in the chest -tremors -unusually weak or tired Side effects that usually do not require medical attention (report to your doctor or health care professional if they continue or are bothersome): -constipation or diarrhea -headache This list may not describe all possible side effects. Call your doctor for medical advice about side effects. You may report side effects to FDA at 1-800-FDA-1088. Where should I keep my medicine? Keep out of the reach of children. Store between 2 and 30 degrees C (36 and 86 degrees F). Throw away any unused medicine after the expiration date. NOTE: This sheet is a summary. It may not cover all possible information. If you have questions about this medicine, talk to your doctor, pharmacist, or health care provider.  2018 Elsevier/Gold Standard (2013-07-04 16:21:52)   Rizatriptan disintegrating tablets What is this medicine? RIZATRIPTAN (rye za TRIP tan) is used to treat migraines with or without aura. An aura is a strange feeling or visual disturbance that warns you of an attack. It is not used to  prevent migraines. This medicine may be used for other purposes; ask your health care provider or pharmacist if you have questions. COMMON BRAND NAME(S): Maxalt-MLT What should I tell my health care provider before I take this medicine? They need to know if you have any of these conditions: -bowel disease or colitis -diabetes -family history of heart disease -fast or irregular heart beat -heart or blood vessel disease, angina (chest pain), or previous heart attack -high blood pressure -high cholesterol -history of stroke, transient ischemic attacks (TIAs or  mini-strokes), or intracranial bleeding -kidney or liver disease -overweight -poor circulation -postmenopausal or surgical removal of uterus and ovaries -an unusual or allergic reaction to rizatriptan, other medicines, foods, dyes, or preservatives -pregnant or trying to get pregnant -breast-feeding How should I use this medicine? Take this medicine by mouth. Follow the directions on the prescription label. This medicine is taken at the first symptoms of a migraine. It is not for everyday use. Leave the tablet in the foil package until you are ready to take it. Do not push the tablet through the blister pack. Peel open the blister pack with dry hands and place the tablet on your tongue. The tablet will dissolve rapidly and be swallowed in your saliva. It is not necessary to drink any water to take this medicine. If your migraine headache returns after one dose, you can take another dose as directed. You must leave at least 2 hours between doses, and do not take more than 30 mg total in 24 hours. If there is no improvement at all after the first dose, do not take a second dose without talking to your doctor or health care professional. Do not take your medicine more often than directed. Talk to your pediatrician regarding the use of this medicine in children. While this drug may be prescribed for children as young as 6 years for selected conditions, precautions do apply. Overdosage: If you think you have taken too much of this medicine contact a poison control center or emergency room at once. NOTE: This medicine is only for you. Do not share this medicine with others. What if I miss a dose? This does not apply; this medicine is not for regular use. What may interact with this medicine? Do not take this medicine with any of the following medicines: -amphetamine, dextroamphetamine or cocaine -dihydroergotamine, ergotamine, ergoloid mesylates, methysergide, or ergot-type medication - do not take  within 24 hours of taking rizatriptan -feverfew -MAOIs like Carbex, Eldepryl, Marplan, Nardil, and Parnate - do not take rizatriptan within 2 weeks of stopping MAOI therapy. -other migraine medicines like almotriptan, eletriptan, naratriptan, sumatriptan, zolmitriptan - do not take within 24 hours of taking rizatriptan -tryptophan This medicine may also interact with the following medications: -medicines for mental depression, anxiety or mood problems -propranolol This list may not describe all possible interactions. Give your health care provider a list of all the medicines, herbs, non-prescription drugs, or dietary supplements you use. Also tell them if you smoke, drink alcohol, or use illegal drugs. Some items may interact with your medicine. What should I watch for while using this medicine? Only take this medicine for a migraine headache. Take it if you get warning symptoms or at the start of a migraine attack. It is not for regular use to prevent migraine attacks. You may get drowsy or dizzy. Do not drive, use machinery, or do anything that needs mental alertness until you know how this medicine affects you. To reduce dizzy or fainting spells, do  not sit or stand up quickly, especially if you are an older patient. Alcohol can increase drowsiness, dizziness and flushing. Avoid alcoholic drinks. Smoking cigarettes may increase the risk of heart-related side effects from using this medicine. If you take migraine medicines for 10 or more days a month, your migraines may get worse. Keep a diary of headache days and medicine use. Contact your healthcare professional if your migraine attacks occur more frequently. What side effects may I notice from receiving this medicine? Side effects that you should report to your doctor or health care professional as soon as possible: -allergic reactions like skin rash, itching or hives, swelling of the face, lips, or tongue -fast, slow, or irregular heart  beat -increased or decreased blood pressure -seizures -severe stomach pain and cramping, bloody diarrhea -signs and symptoms of a blood clot such as breathing problems; changes in vision; chest pain; severe, sudden headache; pain, swelling, warmth in the leg; trouble speaking; sudden numbness or weakness of the face, arm or leg -tingling, pain, or numbness in the face, hands, or feet Side effects that usually do not require medical attention (report to your doctor or health care professional if they continue or are bothersome): -drowsiness -dry mouth -feeling warm, flushing, or redness of the face -headache -muscle cramps, pain -nausea, vomiting -unusually weak or tired This list may not describe all possible side effects. Call your doctor for medical advice about side effects. You may report side effects to FDA at 1-800-FDA-1088. Where should I keep my medicine? Keep out of the reach of children. Store at room temperature between 15 and 30 degrees C (59 and 86 degrees F). Protect from light and moisture. Throw away any unused medicine after the expiration date. NOTE: This sheet is a summary. It may not cover all possible information. If you have questions about this medicine, talk to your doctor, pharmacist, or health care provider.  2018 Elsevier/Gold Standard (2013-05-29 10:17:42)

## 2018-08-22 DIAGNOSIS — G43109 Migraine with aura, not intractable, without status migrainosus: Secondary | ICD-10-CM | POA: Insufficient documentation

## 2018-08-22 DIAGNOSIS — G43009 Migraine without aura, not intractable, without status migrainosus: Secondary | ICD-10-CM | POA: Insufficient documentation

## 2018-08-23 ENCOUNTER — Telehealth: Payer: Self-pay | Admitting: Neurology

## 2018-08-23 NOTE — Telephone Encounter (Signed)
lvm for pt to call back about scheduling mri  [employer] Auth: [id number] (exp. 08/22/18 to 09/20/18)

## 2018-09-04 DIAGNOSIS — F419 Anxiety disorder, unspecified: Secondary | ICD-10-CM | POA: Diagnosis not present

## 2018-09-04 DIAGNOSIS — Z3041 Encounter for surveillance of contraceptive pills: Secondary | ICD-10-CM | POA: Diagnosis not present

## 2018-09-04 DIAGNOSIS — Z3009 Encounter for other general counseling and advice on contraception: Secondary | ICD-10-CM | POA: Diagnosis not present

## 2018-09-29 DIAGNOSIS — Z3202 Encounter for pregnancy test, result negative: Secondary | ICD-10-CM | POA: Diagnosis not present

## 2018-09-29 DIAGNOSIS — Z30430 Encounter for insertion of intrauterine contraceptive device: Secondary | ICD-10-CM | POA: Diagnosis not present

## 2018-10-02 ENCOUNTER — Telehealth: Payer: Self-pay | Admitting: Neurology

## 2018-10-02 NOTE — Telephone Encounter (Signed)
Spoke to the patient she is scheduled at [facility] for 10/24/18 °

## 2018-10-02 NOTE — Telephone Encounter (Signed)
Pt is asking for a call re: scheduling her MRI, please call

## 2018-10-24 ENCOUNTER — Ambulatory Visit: Payer: BLUE CROSS/BLUE SHIELD

## 2018-10-24 DIAGNOSIS — H53133 Sudden visual loss, bilateral: Secondary | ICD-10-CM | POA: Diagnosis not present

## 2018-10-24 DIAGNOSIS — H532 Diplopia: Secondary | ICD-10-CM | POA: Diagnosis not present

## 2018-10-24 DIAGNOSIS — R51 Headache with orthostatic component, not elsewhere classified: Secondary | ICD-10-CM

## 2018-10-24 MED ORDER — GADOBENATE DIMEGLUMINE 529 MG/ML IV SOLN
11.0000 mL | Freq: Once | INTRAVENOUS | Status: AC | PRN
  Administered 2018-10-24: 11 mL via INTRAVENOUS

## 2018-10-27 ENCOUNTER — Telehealth: Payer: Self-pay | Admitting: *Deleted

## 2018-10-27 NOTE — Telephone Encounter (Signed)
-----   Message from Anson Fret, MD sent at 10/26/2018  6:03 PM EST ----- MRI of the brain is unremarkable for age. She has white matter changes that are seen normally in aging and in migraineurs. thanks

## 2018-10-27 NOTE — Telephone Encounter (Signed)
Called pt and discussed MRI brain results including white matter changes that are normally seen in aging and those who have migraines. She verbalized understanding and appreciation and had no questions.

## 2018-10-30 DIAGNOSIS — Z30431 Encounter for routine checking of intrauterine contraceptive device: Secondary | ICD-10-CM | POA: Diagnosis not present

## 2018-11-02 DIAGNOSIS — I10 Essential (primary) hypertension: Secondary | ICD-10-CM | POA: Diagnosis not present

## 2018-11-02 DIAGNOSIS — J309 Allergic rhinitis, unspecified: Secondary | ICD-10-CM | POA: Diagnosis not present

## 2018-11-17 DIAGNOSIS — I10 Essential (primary) hypertension: Secondary | ICD-10-CM | POA: Diagnosis not present

## 2018-12-12 ENCOUNTER — Telehealth: Payer: Self-pay | Admitting: Neurology

## 2018-12-12 ENCOUNTER — Other Ambulatory Visit: Payer: Self-pay | Admitting: *Deleted

## 2018-12-12 MED ORDER — RIZATRIPTAN BENZOATE 10 MG PO TABS: ORAL_TABLET | ORAL | 8 refills | Status: DC

## 2018-12-12 NOTE — Telephone Encounter (Signed)
Order changed to Rizatriptan oral 10 mg tablet #9 with 8 refills per v.o. Dr. Lucia Gaskins.

## 2018-12-12 NOTE — Telephone Encounter (Signed)
Yes olease prescribe

## 2018-12-12 NOTE — Telephone Encounter (Signed)
Patient called in and stated the rizatriptan (MAXALT-MLT) 10 MG disintegrating tablet leaves a nasty taste in her mouth and is wanting to know if she can have something in a pill form

## 2018-12-12 NOTE — Telephone Encounter (Signed)
Called pt on home phone. LVM asking for call back. Also tried pt's cell phone @ [phone]. LVM (ok per DPR) informing pt that Dr. Lucia Gaskins switched her to the Rizatriptan 10 mg tablet that she can swallow. The directions will remain the same as the melt tablet. Take 1 tablet at onset of migraine. Repeat in 2 hours if needed. Max 2 tablets in 24 hours. The prescription was already sent to the pharmacy and her other Maxalt melt tablet has been discontinued. Left office number and asked for call back if she has any questions at all.

## 2019-01-15 DIAGNOSIS — I10 Essential (primary) hypertension: Secondary | ICD-10-CM | POA: Diagnosis not present

## 2019-01-15 DIAGNOSIS — R209 Unspecified disturbances of skin sensation: Secondary | ICD-10-CM | POA: Diagnosis not present

## 2019-01-15 DIAGNOSIS — J301 Allergic rhinitis due to pollen: Secondary | ICD-10-CM | POA: Diagnosis not present

## 2019-01-15 DIAGNOSIS — F411 Generalized anxiety disorder: Secondary | ICD-10-CM | POA: Diagnosis not present

## 2019-02-15 ENCOUNTER — Telehealth: Payer: Self-pay | Admitting: *Deleted

## 2019-02-15 ENCOUNTER — Encounter: Payer: Self-pay | Admitting: *Deleted

## 2019-02-15 NOTE — Telephone Encounter (Signed)
I spoke with the patient and discussed our recommendation to converting appt to a telephone visit d/t lowering the risks associated with the covid19 pandemic. Pt was agreeable to this and requested to r/s to another day. I r/s pt for Wed 5/13 @ 7:30 AM. Pt consented to having the telephone visit filed with insurance. She understands the physical exam will be limited for this visit. She confirmed her name & dob and I updated her chart. No changes to PMH. Meds updated. Pt unsure of recent weight. She verbalized appreciation for the call and understands Dr. Lucia Gaskins will call her for the visit.

## 2019-02-19 ENCOUNTER — Ambulatory Visit: Payer: BLUE CROSS/BLUE SHIELD | Admitting: Neurology

## 2019-02-21 ENCOUNTER — Ambulatory Visit (INDEPENDENT_AMBULATORY_CARE_PROVIDER_SITE_OTHER): Payer: BLUE CROSS/BLUE SHIELD | Admitting: Neurology

## 2019-02-21 ENCOUNTER — Other Ambulatory Visit: Payer: Self-pay

## 2019-02-21 DIAGNOSIS — G43009 Migraine without aura, not intractable, without status migrainosus: Secondary | ICD-10-CM

## 2019-02-21 DIAGNOSIS — G43109 Migraine with aura, not intractable, without status migrainosus: Secondary | ICD-10-CM

## 2019-02-21 MED ORDER — SUMATRIPTAN SUCCINATE 100 MG PO TABS: 100.0000 mg | ORAL_TABLET | Freq: Once | ORAL | 12 refills | Status: AC | PRN

## 2019-02-21 MED ORDER — METOCLOPRAMIDE HCL 10 MG PO TABS
10.0000 mg | ORAL_TABLET | Freq: Three times a day (TID) | ORAL | 6 refills | Status: AC | PRN
Start: 2019-02-21 — End: ?

## 2019-02-21 NOTE — Progress Notes (Signed)
[facility]    Provider:  Dr Lucia GaskinsAhern Referring Provider: Darrow BussingKoirala, [name], MD Primary Care Physician:  Arlana LindauFisher, [name], NP  CC:  Migraines  Virtual Visit via Telephone Note  I connected with Madison Mainsandice S Kempen on 02/21/19 at  7:30 AM EDT by telephone and verified that I am speaking with the correct person using two identifiers.  Location: Patient: home Provider: office   I discussed the limitations, risks, security and privacy concerns of performing an evaluation and management service by telephone and the availability of in person appointments. I also discussed with the patient that there may be a patient responsible charge related to this service. The patient expressed understanding and agreed to proceed.   Follow Up Instructions:    I discussed the assessment and treatment plan with the patient. The patient was provided an opportunity to ask questions and all were answered. The patient agreed with the plan and demonstrated an understanding of the instructions.   The patient was advised to call back or seek an in-person evaluation if the symptoms worsen or if the condition fails to improve as anticipated.  I provided 21 minutes of non-face-to-face time during this encounter.   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 She will go a month or two without a migraine but then she will have clusters. The maxalt works but takes 1.5 hours. She did not like the dissolvable maxalt so we switched to pill form. The same for the zofran. Changing to imitrex and reglan.   Personally reviewed images of MRI brain 10/24/2018 and agree with the following: MRI brain (with and without) demonstrating: - Scattered periventricular and subcortical foci of non-specific T2 hyperintensities. These findings are non-specific and considerations include autoimmune, inflammatory, post-infectious, microvascular ischemic  or migraine associated etiologies.  - No abnormal lesions are seen on post contrast views.  No acute findings.  HPI:  Madison Huang is a 38 y.o. female here as requested by Dr. Docia ChuckKoirala for headaches and vision changes ongoing for 26 years.  Past medical history of elevated blood pressure, eczema, childhood asthma, migraine, depression, anxiety. Patient has had headaches since a teenager, the first time she had an aura she couldn't see anything like a flashlight was in her eye, she had nausea and vomiting. She continued to have migraines but didn't know what it was until college. She loses vision.  She does not always get the head pain after the aura. She does not always have an aura. The pain is behind the eyes, pulsating/pounding/throbbing, +photo/phonophobia, +nausea and vomiting, a dark room helps, movement makes it worse as does position.   She can go 2-3 months and not have a migraine. Aura is in both eyes. But she can have multiple migraines in a week. Can last 24 hours and be moderately severe to severe. Tylenol will help.  The aura is becoming more severe, losing vision, more frequent, vision blurry or loss of vision completely where she can't see part of her vision, blurry to the point she can't drive she has to pull over. Recently more stress, in the setting of stress. She gets aura 1x a week for a few hours. Mother has migraine.  Reviewed notes, labs and imaging from outside physicians, which showed:  Reviewed provider notes Arlana LindauJulie Fisher, patient was seen by her gynecologist due to migraine with aura was discussing contraception.  Patient says she has been afraid to  reveal symptoms that she felt she would be labeled as making it up.  Complaining of blurred vision, photophobia, tunneling of vision with scotoma, nausea with occasional vomiting and stabbing pain behind the eyes.  Headaches tend to occur in clusters a few times a week and then may be only once per month.  Also complaining of  fatigue and swishing sound in the ears.  Slight elevation in blood pressure which is occurred previously but no work-up or medications.  Exam was only significant for rapid heart rate otherwise physical normal.  She was referred here for management of her headaches/migraines.  Will request records of her recent labs that were drawn  Review of Systems: Patient complains of symptoms per HPI as well as the following symptoms: Fatigue, blurred vision, headache, dizziness, depression, anxiety, not enough sleep, decreased energy, insomnia, sleepiness. Pertinent negatives and positives per HPI. All others negative.reviewed with patient.   Social History   Socioeconomic History   Marital status: Married    Spouse name: Not on file   Number of children: 1   Years of education: Not on file   Highest education level: Some college, no degree  Occupational History    Employer: [employer]  Social Network engineer strain: Not on file   Food insecurity:    Worry: Not on file    Inability: Not on file   Transportation needs:    Medical: Not on file    Non-medical: Not on file  Tobacco Use   Smoking status: Never Smoker   Smokeless tobacco: Never Used  Substance and Sexual Activity   Alcohol use: No    Comment: quit August 2011   Drug use: Never   Sexual activity: Yes    Partners: Male    Birth control/protection: I.U.D.  Lifestyle   Physical activity:    Days per week: Not on file    Minutes per session: Not on file   Stress: Not on file  Relationships   Social connections:    Talks on phone: Not on file    Gets together: Not on file    Attends religious service: Not on file    Active member of club or organization: Not on file    Attends meetings of clubs or organizations: Not on file    Relationship status: Not on file   Intimate partner violence:    Fear of current or ex partner: Not on file    Emotionally abused: Not on file    Physically abused: Not on  file    Forced sexual activity: Not on file  Other Topics Concern   Not on file  Social History Narrative   Lives at home with husband and son   Right handed   Caffeine: 2x per month     Family History  Problem Relation Age of Onset   Diabetes Maternal Grandmother    Stroke Mother    Cancer Mother        breast   Migraines Mother        on preventatives    Diabetes Father    Hypertension Father    Diabetes Paternal Grandfather    Cancer Maternal Aunt        uterine    Past Medical History:  Diagnosis Date   Active labor 05/04/2012   Anxiety    Asthma    as a child not since college   Breast lump    Depression    Eczema  H/O migraine with aura    H/O varicella    High blood pressure 2019   on Lisinopril   Leukocytosis 05/06/2012   Sickle cell trait (HCC)    SVD 05/04/12 M 05/05/2012    No past surgical history on file.  Current Outpatient Medications  Medication Sig Dispense Refill   levocetirizine (XYZAL) 5 MG tablet Take 5 mg by mouth every evening.  0   lisinopril (PRINIVIL,ZESTRIL) 10 MG tablet Take 5 mg by mouth daily.      metoCLOPramide (REGLAN) 10 MG tablet Take 1 tablet (10 mg total) by mouth every 8 (eight) hours as needed for nausea. Take this for migraine or nausea. May take this alone or with a triptan ( Sumatriptan) 30 tablet 6   montelukast (SINGULAIR) 10 MG tablet Take 10 mg by mouth at bedtime.   0   SUMAtriptan (IMITREX) 100 MG tablet Take 1 tablet (100 mg total) by mouth once as needed for up to 1 dose. May repeat in 2 hours if headache persists or recurs. 10 tablet 12   UNABLE TO FIND Med Name: Penni Bombard IUD     No current facility-administered medications for this visit.     Allergies as of 02/21/2019   (No Known Allergies)    Vitals: There were no vitals taken for this visit. Last Weight:  Wt Readings from Last 1 Encounters:  08/21/18 122 lb (55.3 kg)   Last Height:   Ht Readings from Last 1 Encounters:    08/21/18  (1.6 m)   Prior exam Physical exam: Exam: Gen: NAD, conversant, well nourised, well groomed                     CV: RRR, no MRG. No Carotid Bruits. No peripheral edema, warm, nontender Eyes: Conjunctivae clear without exudates or hemorrhage  Neuro: Detailed Neurologic Exam  Speech:    Speech is normal; fluent and spontaneous with normal comprehension.  Cognition:    The patient is oriented to person, place, and time;     recent and remote memory intact;     language fluent;     normal attention, concentration,     fund of knowledge Cranial Nerves:    The pupils are equal, round, and reactive to light. The fundi are normal and spontaneous venous pulsations are present. Visual fields are full to finger confrontation. Extraocular movements are intact. Trigeminal sensation is intact and the muscles of mastication are normal. The face is symmetric. The palate elevates in the midline. Hearing intact. Voice is normal. Shoulder shrug is normal. The tongue has normal motion without fasciculations.   Coordination:    Normal finger to nose and heel to shin. Normal rapid alternating movements.   Gait:    Heel-toe and tandem gait are normal.   Motor Observation:    No asymmetry, no atrophy, and no involuntary movements noted. Tone:    Normal muscle tone.    Posture:    Posture is normal. normal erect    Strength:    Strength is V/V in the upper and lower limbs.      Sensation: intact to LT     Reflex Exam:  DTR's:    Deep tendon reflexes in the upper and lower extremities are normal bilaterally.   Toes:    The toes are downgoing bilaterally.   Clonus:    Clonus is absent.       Assessment/Plan:   38 year old with migraines with and without aura.  She will go a month or two without a migraine but then she will have clusters.   MRI of the brain w/wo contrast: unremarkable  - The maxalt works but takes 1.5 hours. She did not like the dissolvable maxalt so we  switched to pill form. The same for the zofran. Changing to imitrex and reglan.   Magnesium citrate 400mg  to 600mg  daily as this has evidence in Migraine with aura  Also riboflavin 400mg  daily, Coenzyme Q 10 100mg  three times daily  Acute Management: Imitrex. Please take one tablet at the onset of your headache. If it does not improve the symptoms please take one additional tablet. Do not take more then 2 tablets in 24hrs. Do not take use more then 2 to 3 times in a week.  For nausea and migraine: Reglan  Meds ordered this encounter  Medications   SUMAtriptan (IMITREX) 100 MG tablet    Sig: Take 1 tablet (100 mg total) by mouth once as needed for up to 1 dose. May repeat in 2 hours if headache persists or recurs.    Dispense:  10 tablet    Refill:  12   metoCLOPramide (REGLAN) 10 MG tablet    Sig: Take 1 tablet (10 mg total) by mouth every 8 (eight) hours as needed for nausea. Take this for migraine or nausea. May take this alone or with a triptan ( Sumatriptan)    Dispense:  30 tablet    Refill:  6   Discussed:  There is increased risk for stroke in women with migraine with aura and a contraindication for the combined contraceptive pill for use by women who have migraine with aura. The risk for women with migraine without aura is lower. However other risk factors like smoking are far more likely to increase stroke risk than migraine. There is a recommendation for no smoking and for the use of OCPs without estrogen such as progestogen only pills particularly for women with migraine with aura.Marland Kitchen People who have migraine headaches with auras may be 3 times more likely to have a stroke caused by a blood clot, compared to migraine patients who don't see auras. Women who take hormone-replacement therapy may be 30 percent more likely to suffer a clot-based stroke than women not taking medication containing estrogen. Other risk factors like smoking and high blood pressure may be  much more  important."  Discussed: To prevent or relieve headaches, try the following: Cool Compress. Lie down and place a cool compress on your head.  Avoid headache triggers. If certain foods or odors seem to have triggered your migraines in the past, avoid them. A headache diary might help you identify triggers.  Include physical activity in your daily routine. Try a daily walk or other moderate aerobic exercise.  Manage stress. Find healthy ways to cope with the stressors, such as delegating tasks on your to-do list.  Practice relaxation techniques. Try deep breathing, yoga, massage and visualization.  Eat regularly. Eating regularly scheduled meals and maintaining a healthy diet might help prevent headaches. Also, drink plenty of fluids.  Follow a regular sleep schedule. Sleep deprivation might contribute to headaches Consider biofeedback. With this mind-body technique, you learn to control certain bodily functions -- such as muscle tension, heart rate and blood pressure -- to prevent headaches or reduce headache pain.    Proceed to emergency room if you experience new or worsening symptoms or symptoms do not resolve, if you have new neurologic symptoms or if headache is severe, or  for any concerning symptom.   Provided education and documentation from American headache Society toolbox including articles on: chronic migraine medication overuse headache, chronic migraines, prevention of migraines, behavioral and other nonpharmacologic treatments for headache.  Cc: Arlana Lindau, NP  Naomie Dean, MD  [facility] [address] [place], [place] [address]  Phone [phone] Fax [phone]

## 2019-03-21 DIAGNOSIS — I10 Essential (primary) hypertension: Secondary | ICD-10-CM | POA: Diagnosis not present

## 2019-06-12 DIAGNOSIS — N760 Acute vaginitis: Secondary | ICD-10-CM | POA: Diagnosis not present

## 2019-06-12 DIAGNOSIS — Z1231 Encounter for screening mammogram for malignant neoplasm of breast: Secondary | ICD-10-CM | POA: Diagnosis not present

## 2019-06-12 DIAGNOSIS — Z6821 Body mass index (BMI) 21.0-21.9, adult: Secondary | ICD-10-CM | POA: Diagnosis not present

## 2019-06-12 DIAGNOSIS — Z1151 Encounter for screening for human papillomavirus (HPV): Secondary | ICD-10-CM | POA: Diagnosis not present

## 2019-06-12 DIAGNOSIS — Z01419 Encounter for gynecological examination (general) (routine) without abnormal findings: Secondary | ICD-10-CM | POA: Diagnosis not present

## 2019-08-01 DIAGNOSIS — B373 Candidiasis of vulva and vagina: Secondary | ICD-10-CM | POA: Diagnosis not present

## 2019-08-01 DIAGNOSIS — Z1159 Encounter for screening for other viral diseases: Secondary | ICD-10-CM | POA: Diagnosis not present

## 2019-08-01 DIAGNOSIS — Z118 Encounter for screening for other infectious and parasitic diseases: Secondary | ICD-10-CM | POA: Diagnosis not present

## 2019-08-01 DIAGNOSIS — Z114 Encounter for screening for human immunodeficiency virus [HIV]: Secondary | ICD-10-CM | POA: Diagnosis not present

## 2019-08-01 DIAGNOSIS — R350 Frequency of micturition: Secondary | ICD-10-CM | POA: Diagnosis not present

## 2019-08-01 DIAGNOSIS — Z113 Encounter for screening for infections with a predominantly sexual mode of transmission: Secondary | ICD-10-CM | POA: Diagnosis not present

## 2019-08-16 DIAGNOSIS — F430 Acute stress reaction: Secondary | ICD-10-CM | POA: Diagnosis not present

## 2019-08-16 DIAGNOSIS — F321 Major depressive disorder, single episode, moderate: Secondary | ICD-10-CM | POA: Diagnosis not present

## 2019-08-22 DIAGNOSIS — F321 Major depressive disorder, single episode, moderate: Secondary | ICD-10-CM | POA: Diagnosis not present

## 2019-08-22 DIAGNOSIS — F411 Generalized anxiety disorder: Secondary | ICD-10-CM | POA: Diagnosis not present

## 2019-11-10 ENCOUNTER — Emergency Department (HOSPITAL_COMMUNITY)
Admission: EM | Admit: 2019-11-10 | Discharge: 2019-11-11 | Disposition: A | Discharge status: Home / Self Care | Payer: BC Managed Care – PPO | Attending: Emergency Medicine | Admitting: Emergency Medicine

## 2019-11-10 DIAGNOSIS — Z79899 Other long term (current) drug therapy: Secondary | ICD-10-CM | POA: Insufficient documentation

## 2019-11-10 DIAGNOSIS — R0789 Other chest pain: Secondary | ICD-10-CM | POA: Diagnosis not present

## 2019-11-10 DIAGNOSIS — R079 Chest pain, unspecified: Secondary | ICD-10-CM | POA: Diagnosis not present

## 2019-11-10 DIAGNOSIS — I10 Essential (primary) hypertension: Secondary | ICD-10-CM | POA: Diagnosis not present

## 2019-11-10 DIAGNOSIS — R072 Precordial pain: Secondary | ICD-10-CM

## 2019-11-11 ENCOUNTER — Emergency Department (HOSPITAL_COMMUNITY): Payer: BC Managed Care – PPO

## 2019-11-11 ENCOUNTER — Other Ambulatory Visit: Payer: Self-pay

## 2019-11-11 ENCOUNTER — Encounter (HOSPITAL_COMMUNITY): Payer: Self-pay | Admitting: *Deleted

## 2019-11-11 DIAGNOSIS — R0602 Shortness of breath: Secondary | ICD-10-CM | POA: Diagnosis not present

## 2019-11-11 DIAGNOSIS — R072 Precordial pain: Secondary | ICD-10-CM | POA: Diagnosis not present

## 2019-11-11 DIAGNOSIS — R079 Chest pain, unspecified: Secondary | ICD-10-CM | POA: Diagnosis not present

## 2019-11-11 DIAGNOSIS — D72829 Elevated white blood cell count, unspecified: Secondary | ICD-10-CM | POA: Diagnosis not present

## 2019-11-11 LAB — BASIC METABOLIC PANEL
Anion gap: 11 (ref 5–15)
BUN: 13 mg/dL (ref 6–20)
CO2: 21 mmol/L — ABNORMAL LOW (ref 22–32)
Calcium: 9.1 mg/dL (ref 8.9–10.3)
Chloride: 106 mmol/L (ref 98–111)
Creatinine, Ser: 0.91 mg/dL (ref 0.44–1.00)
GFR calc Af Amer: >60 mL/min (ref >60)
GFR calc non Af Amer: >60 mL/min (ref >60)
Glucose, Bld: 117 mg/dL — ABNORMAL HIGH (ref 70–99)
Potassium: 3.6 mmol/L (ref 3.5–5.1)
Sodium: 138 mmol/L (ref 135–145)

## 2019-11-11 LAB — TROPONIN I (HIGH SENSITIVITY)
Troponin I (High Sensitivity): <2 ng/L (ref <18)
Troponin I (High Sensitivity): <2 ng/L (ref <18)

## 2019-11-11 LAB — CBC
HCT: 36.6 % (ref 36.0–46.0)
Hemoglobin: 12.4 g/dL (ref 12.0–15.0)
MCH: 29.3 pg (ref 26.0–34.0)
MCHC: 33.9 g/dL (ref 30.0–36.0)
MCV: 86.5 fL (ref 80.0–100.0)
Platelets: 243 10*3/uL (ref 150–400)
RBC: 4.23 MIL/uL (ref 3.87–5.11)
RDW: 13.9 % (ref 11.5–15.5)
WBC: 7.4 10*3/uL (ref 4.0–10.5)
nRBC: 0 % (ref 0.0–0.2)

## 2019-11-11 LAB — I-STAT BETA HCG BLOOD, ED (MC, WL, AP ONLY): I-stat hCG, quantitative: <5 m[IU]/mL (ref <5)

## 2019-11-11 MED ORDER — SODIUM CHLORIDE 0.9% FLUSH: 3.0000 mL | Freq: Once | INTRAVENOUS | Status: DC

## 2019-11-11 MED ORDER — OMEPRAZOLE 20 MG PO CPDR: 20.0000 mg | DELAYED_RELEASE_CAPSULE | Freq: Every day | ORAL | 0 refills | Status: AC

## 2019-11-11 MED ORDER — ALUM & MAG HYDROXIDE-SIMETH 200-200-20 MG/5ML PO SUSP
30.0000 mL | Freq: Once | ORAL | Status: AC
  Administered 2019-11-11: 02:00:00 30 mL via ORAL
  Filled 2019-11-11: qty 30

## 2019-11-11 NOTE — ED Provider Notes (Signed)
[facility] EMERGENCY DEPARTMENT Provider Note   CSN: [id number] Arrival date & time: 11/10/19  2351     History Chief Complaint  Patient presents with  . Chest Pain    Madison Huang is a 39 y.o. female.  The history is provided by the patient.  Chest Pain Pain location:  Substernal area (radiates in straight line up to the throat) Pain quality: dull   Pain severity:  Moderate Onset quality:  Sudden Timing:  Constant Progression:  Unchanged Chronicity:  New Context: eating   Context comment:  Broccoli and salmon and cookies Relieved by:  Nothing Worsened by:  Nothing Ineffective treatments:  None tried Associated symptoms: no abdominal pain, no AICD problem, no altered mental status, no anorexia, no anxiety, no back pain, no claudication, no cough, no diaphoresis, no dizziness, no dysphagia, no fatigue, no fever, no headache, no heartburn, no lower extremity edema, no nausea, no near-syncope, no numbness, no orthopnea, no palpitations, no PND, no shortness of breath, no syncope, no vomiting and no weakness   Risk factors: no birth control, not pregnant and no prior DVT/PE        Past Medical History:  Diagnosis Date  . Active labor 05/04/2012  . Anxiety   . Asthma    as a child not since college  . Breast lump   . Depression   . Eczema   . H/O migraine with aura   . H/O varicella   . High blood pressure 2019   on Lisinopril  . Leukocytosis 05/06/2012  . Sickle cell trait (HCC)   . SVD 05/04/12 M 05/05/2012    Patient Active Problem List   Diagnosis Date Noted  . Migraine with aura and without status migrainosus, not intractable 08/22/2018  . Migraine without aura and without status migrainosus, not intractable 08/22/2018  . Leukocytosis 05/06/2012  . SVD 05/04/12 M 05/05/2012    History reviewed. No pertinent surgical history.   OB History    Gravida  1   Para  1   Term  1   Preterm  0   AB  0   Living  1     SAB  0   TAB  0   Ectopic  0   Multiple  0   Live Births  1           Family History  Problem Relation Age of Onset  . Diabetes Maternal Grandmother   . Stroke Mother   . Cancer Mother        breast  . Migraines Mother        on preventatives   . Diabetes Father   . Hypertension Father   . Diabetes Paternal Grandfather   . Cancer Maternal Aunt        uterine    Social History   Tobacco Use  . Smoking status: Never Smoker  . Smokeless tobacco: Never Used  Substance Use Topics  . Alcohol use: No    Comment: quit August 2011  . Drug use: Never    Home Medications Prior to Admission medications   Medication Sig Start Date End Date Taking? Authorizing Provider  levocetirizine (XYZAL) 5 MG tablet Take 5 mg by mouth every evening. 08/03/18   [provider]  lisinopril (PRINIVIL,ZESTRIL) 10 MG tablet Take 5 mg by mouth daily.     [provider]  metoCLOPramide (REGLAN) 10 MG tablet Take 1 tablet (10 mg total) by mouth every 8 (eight) hours as  needed for nausea. Take this for migraine or nausea. May take this alone or with a triptan ( Sumatriptan) 02/21/19   Anson Fret, MD  montelukast (SINGULAIR) 10 MG tablet Take 10 mg by mouth at bedtime.  08/03/18   [provider]  SUMAtriptan (IMITREX) 100 MG tablet Take 1 tablet (100 mg total) by mouth once as needed for up to 1 dose. May repeat in 2 hours if headache persists or recurs. 02/21/19   Anson Fret, MD  UNABLE TO FIND Med Name: Penni Bombard IUD    [provider]    Allergies    Patient has no known allergies.  Review of Systems   Review of Systems  Constitutional: Negative for diaphoresis, fatigue and fever.  HENT: Negative for trouble swallowing.   Eyes: Negative for visual disturbance.  Respiratory: Negative for cough and shortness of breath.   Cardiovascular: Positive for chest pain. Negative for palpitations, orthopnea, claudication, syncope, PND and near-syncope.    Gastrointestinal: Negative for abdominal pain, anorexia, heartburn, nausea and vomiting.  Genitourinary: Negative for difficulty urinating.  Musculoskeletal: Negative for back pain.  Neurological: Negative for dizziness, weakness, numbness and headaches.  Psychiatric/Behavioral: Negative for agitation.  All other systems reviewed and are negative.   Physical Exam Updated Vital Signs BP (!) 121/94   Pulse 68   Temp 98.5 F (36.9 C) (Oral)   Resp 15   Ht 5\' 3"  (1.6 m)   Wt 62.6 kg   LMP 11/11/2019   SpO2 100%   BMI 24.45 kg/m   Physical Exam Vitals and nursing note reviewed.  Constitutional:      General: She is not in acute distress.    Appearance: Normal appearance. She is not diaphoretic.  HENT:     Head: Normocephalic and atraumatic.     Nose: Nose normal.  Eyes:     Conjunctiva/sclera: Conjunctivae normal.     Pupils: Pupils are equal, round, and reactive to light.  Cardiovascular:     Rate and Rhythm: Normal rate and regular rhythm.     Pulses: Normal pulses.     Heart sounds: Normal heart sounds.  Pulmonary:     Effort: Pulmonary effort is normal.     Breath sounds: Normal breath sounds.  Abdominal:     General: Abdomen is flat. Bowel sounds are normal.     Tenderness: There is no abdominal tenderness. There is no guarding or rebound.     Comments: Hyperactive BS in the epigastrum  Musculoskeletal:        General: Normal range of motion.     Cervical back: Normal range of motion and neck supple.  Skin:    General: Skin is warm and dry.     Capillary Refill: Capillary refill takes less than 2 seconds.  Neurological:     General: No focal deficit present.     Mental Status: She is alert and oriented to person, place, and time.     Deep Tendon Reflexes: Reflexes normal.  Psychiatric:        Mood and Affect: Mood normal.        Behavior: Behavior normal.     ED Results / Procedures / Treatments   Labs (all labs ordered are listed, but only abnormal  results are displayed) Results for orders placed or performed during the hospital encounter of 11/10/19  Basic metabolic panel  Result Value Ref Range   Sodium 138 135 - 145 mmol/L   Potassium 3.6 3.5 - 5.1 mmol/L  Chloride 106 98 - 111 mmol/L   CO2 21 (L) 22 - 32 mmol/L   Glucose, Bld 117 (H) 70 - 99 mg/dL   BUN 13 6 - 20 mg/dL   Creatinine, Ser 0.91 0.44 - 1.00 mg/dL   Calcium 9.1 8.9 - 10.3 mg/dL   GFR calc non Af Amer >60 >60 mL/min   GFR calc Af Amer >60 >60 mL/min   Anion gap 11 5 - 15  CBC  Result Value Ref Range   WBC 7.4 4.0 - 10.5 K/uL   RBC 4.23 3.87 - 5.11 MIL/uL   Hemoglobin 12.4 12.0 - 15.0 g/dL   HCT 36.6 36.0 - 46.0 %   MCV 86.5 80.0 - 100.0 fL   MCH 29.3 26.0 - 34.0 pg   MCHC 33.9 30.0 - 36.0 g/dL   RDW 13.9 11.5 - 15.5 %   Platelets 243 150 - 400 K/uL   nRBC 0.0 0.0 - 0.2 %  I-Stat beta hCG blood, ED  Result Value Ref Range   I-stat hCG, quantitative <5.0 <5 mIU/mL   Comment 3          Troponin I (High Sensitivity)  Result Value Ref Range   Troponin I (High Sensitivity) <2 <18 ng/L  Troponin I (High Sensitivity)  Result Value Ref Range   Troponin I (High Sensitivity) <2 <18 ng/L   DG Chest 2 View  Result Date: 11/11/2019 CLINICAL DATA:  Central chest pain and shortness of breath. EXAM: CHEST - 2 VIEW COMPARISON:  None. FINDINGS: The cardiomediastinal contours are normal. The lungs are clear. Pulmonary vasculature is normal. No consolidation, pleural effusion, or pneumothorax. No acute osseous abnormalities are seen. IMPRESSION: Negative radiographs of the chest. Electronically Signed   By: Keith Rake M.D.   On: 11/11/2019 00:23    EKG EKG Interpretation  Date/Time:  Saturday November 10 2019 23:53:26 EST Ventricular Rate:  85 PR Interval:  136 QRS Duration: 84 QT Interval:  368 QTC Calculation: 437 R Axis:   82 Text Interpretation: Normal sinus rhythm with sinus arrhythmia Confirmed by Dory Horn) on 11/11/2019 1:35:12  AM   Radiology DG Chest 2 View  Result Date: 11/11/2019 CLINICAL DATA:  Central chest pain and shortness of breath. EXAM: CHEST - 2 VIEW COMPARISON:  None. FINDINGS: The cardiomediastinal contours are normal. The lungs are clear. Pulmonary vasculature is normal. No consolidation, pleural effusion, or pneumothorax. No acute osseous abnormalities are seen. IMPRESSION: Negative radiographs of the chest. Electronically Signed   By: Keith Rake M.D.   On: 11/11/2019 00:23    Procedures Procedures (including critical care time)  Medications Ordered in ED Medications  sodium chloride flush (NS) 0.9 % injection 3 mL (has no administration in time range)  alum & mag hydroxide-simeth (MAALOX/MYLANTA) 200-200-20 MG/5ML suspension 30 mL (30 mLs Oral Given 11/11/19 0228)    ED Course  I have reviewed the triage vital signs and the nursing notes.  Pertinent labs & imaging results that were available during my care of the patient were reviewed by me and considered in my medical decision making (see chart for details).    Symptoms consistent with GERD.  Will treat for same.  PERC negative wells 0 highly doubt PE in this low risk patient, symptoms are not consistent with this.  HEART score is 1 very low risk for MACE.   Madison Huang was evaluated in Emergency Department on 11/11/2019 for the symptoms described in the history of present illness. She was evaluated in  the context of the global COVID-19 pandemic, which necessitated consideration that the patient might be at risk for infection with the SARS-CoV-2 virus that causes COVID-19. Institutional protocols and algorithms that pertain to the evaluation of patients at risk for COVID-19 are in a state of rapid change based on information released by regulatory bodies including the CDC and federal and state organizations. These policies and algorithms were followed during the patient's care in the ED.   Final Clinical Impression(s) / ED  Diagnoses Return for weakness, numbness, changes in vision or speech, fevers >100.4 unrelieved by medication, shortness of breath, intractable vomiting, or diarrhea, abdominal pain, Inability to tolerate liquids or food, cough, altered mental status or any concerns. No signs of systemic illness or infection. The patient is nontoxic-appearing on exam and vital signs are within normal limits.   I have reviewed the triage vital signs and the nursing notes. Pertinent labs &imaging results that were available during my care of the patient were reviewed by me and considered in my medical decision making (see chart for details).  After history, exam, and medical workup I feel the patient has been appropriately medically screened and is safe for discharge home. Pertinent diagnoses were discussed with the patient. Patient was given return precautions   Alilah Mcmeans, MD 11/11/19 [phone]

## 2019-11-11 NOTE — ED Triage Notes (Signed)
The pt arrived by [facility] from home central chest pain since 2130 with some sob  No history  Ems gave 324mg  of aspirin and 2 sl nitros  Pain is gone for thje present  lmp last month

## 2019-12-28 DIAGNOSIS — K219 Gastro-esophageal reflux disease without esophagitis: Secondary | ICD-10-CM | POA: Diagnosis not present

## 2019-12-28 DIAGNOSIS — F321 Major depressive disorder, single episode, moderate: Secondary | ICD-10-CM | POA: Diagnosis not present

## 2019-12-28 DIAGNOSIS — F411 Generalized anxiety disorder: Secondary | ICD-10-CM | POA: Diagnosis not present

## 2020-01-18 DIAGNOSIS — F332 Major depressive disorder, recurrent severe without psychotic features: Secondary | ICD-10-CM | POA: Diagnosis not present

## 2020-01-23 DIAGNOSIS — K219 Gastro-esophageal reflux disease without esophagitis: Secondary | ICD-10-CM | POA: Diagnosis not present

## 2020-01-23 DIAGNOSIS — F321 Major depressive disorder, single episode, moderate: Secondary | ICD-10-CM | POA: Diagnosis not present

## 2020-01-23 DIAGNOSIS — I10 Essential (primary) hypertension: Secondary | ICD-10-CM | POA: Diagnosis not present

## 2020-02-19 DIAGNOSIS — R140 Abdominal distension (gaseous): Secondary | ICD-10-CM | POA: Diagnosis not present

## 2020-02-19 DIAGNOSIS — K219 Gastro-esophageal reflux disease without esophagitis: Secondary | ICD-10-CM | POA: Diagnosis not present

## 2020-02-19 DIAGNOSIS — K5904 Chronic idiopathic constipation: Secondary | ICD-10-CM | POA: Diagnosis not present

## 2020-03-18 DIAGNOSIS — Z0000 Encounter for general adult medical examination without abnormal findings: Secondary | ICD-10-CM | POA: Diagnosis not present

## 2020-03-18 DIAGNOSIS — Z13220 Encounter for screening for lipoid disorders: Secondary | ICD-10-CM | POA: Diagnosis not present

## 2020-07-12 IMAGING — DX DG CHEST 2V
2 series · 2 of 2 positions shown · non-contrast
Comparison: None.

CLINICAL DATA: Central chest pain and shortness of breath.

EXAM:
CHEST - 2 VIEW

[chest pa]
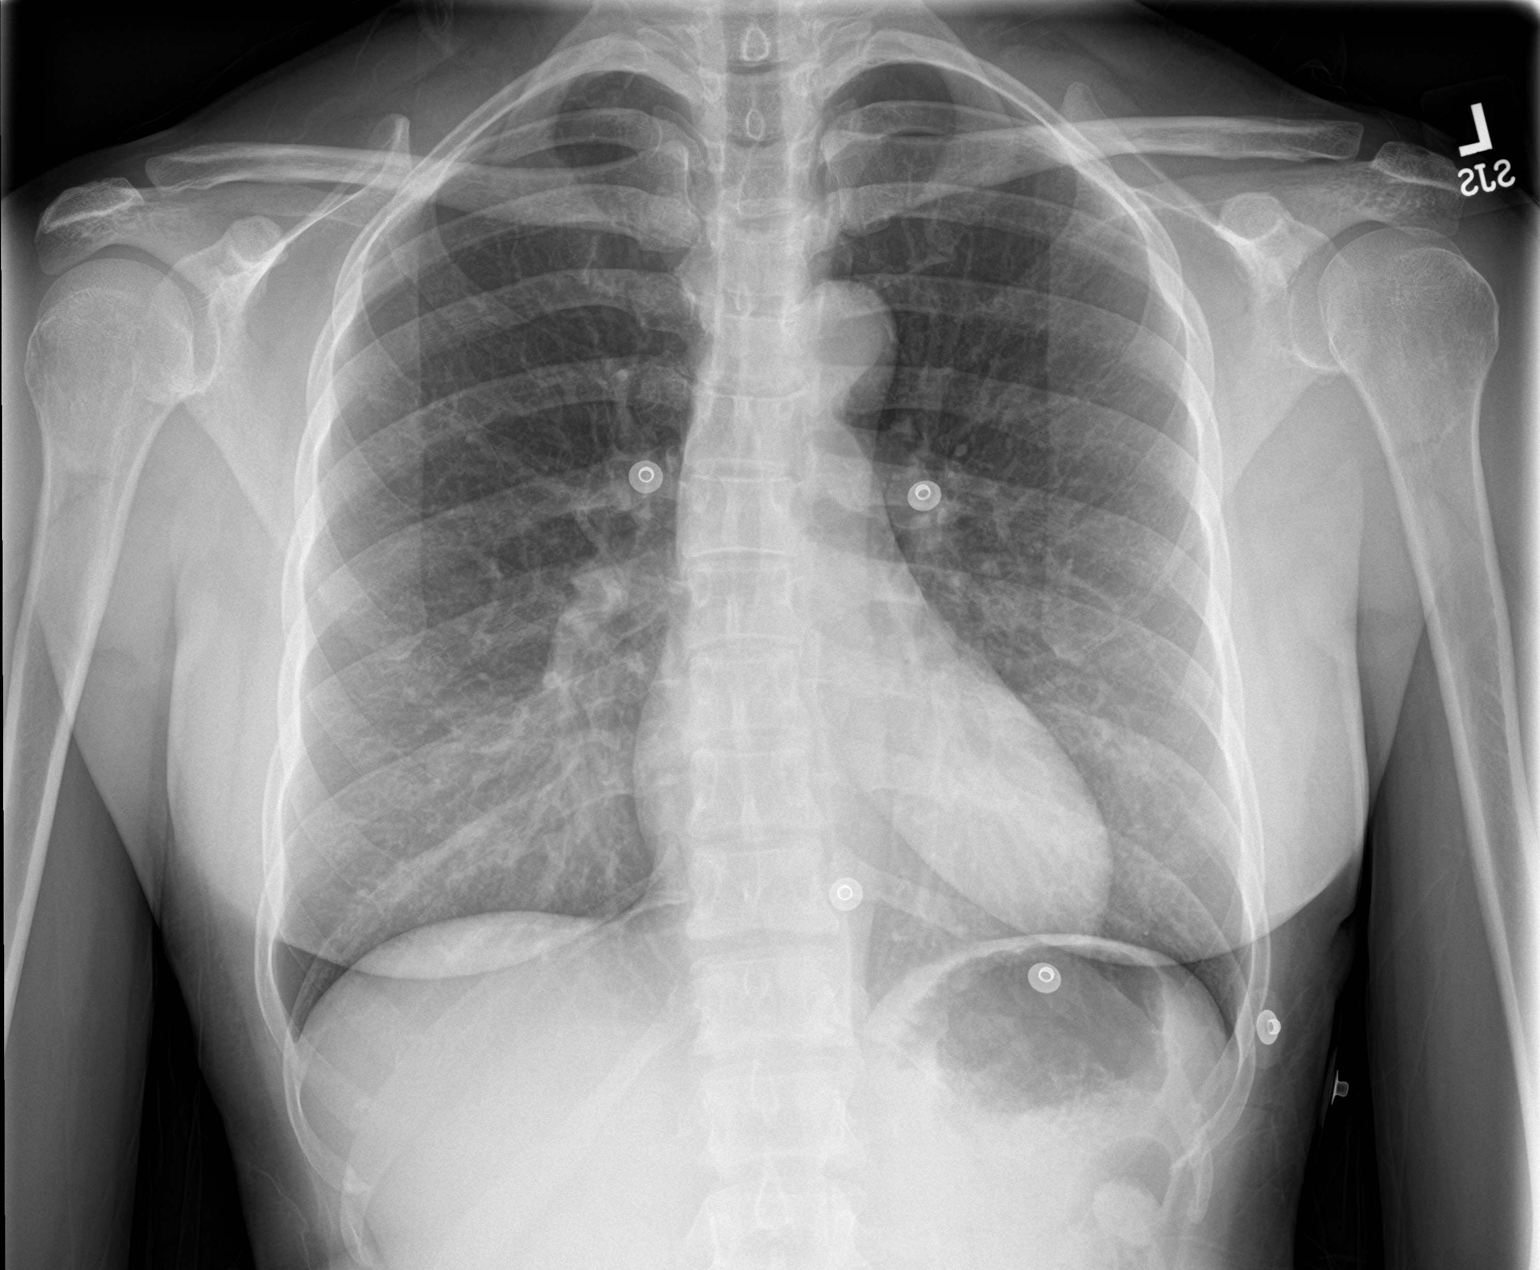

[chest lat]
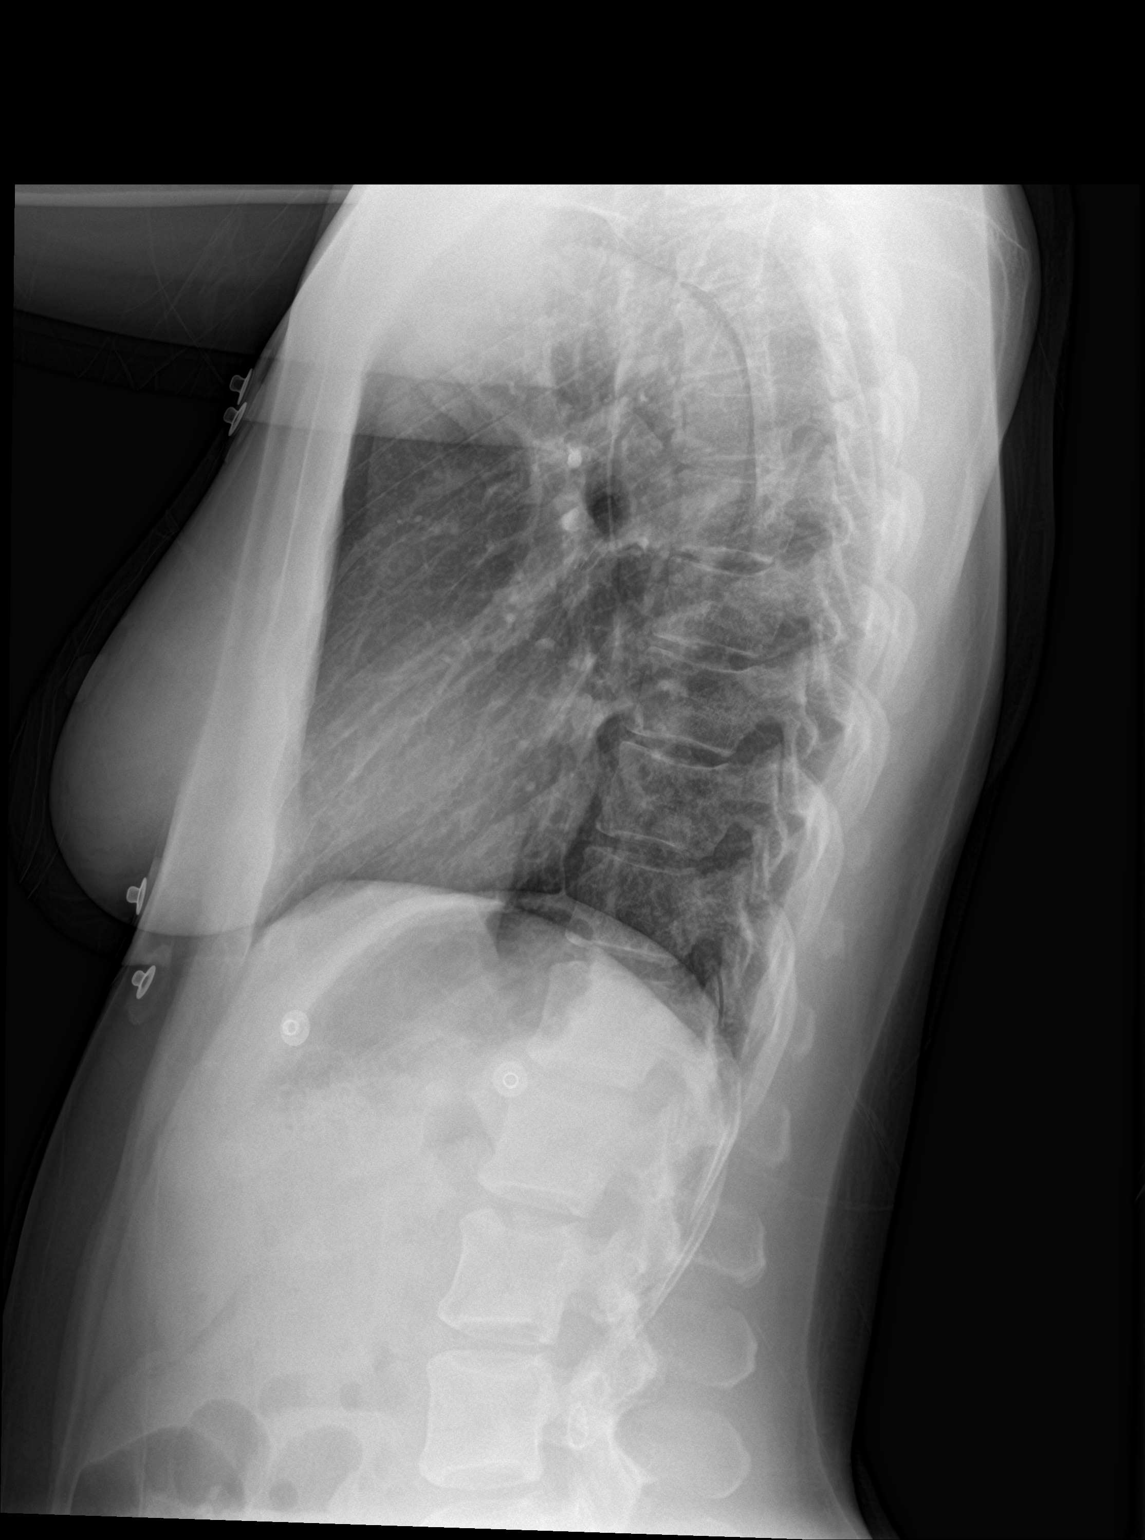

[2 of 2 positions shown; findings below may reference images not displayed]

FINDINGS: The cardiomediastinal contours are normal. The lungs are clear.
Pulmonary vasculature is normal. No consolidation, pleural effusion,
or pneumothorax. No acute osseous abnormalities are seen.
IMPRESSION: Negative radiographs of the chest.

## 2020-09-01 ENCOUNTER — Telehealth: Payer: Self-pay | Admitting: Genetic Counselor

## 2020-09-01 NOTE — Telephone Encounter (Signed)
Scheduled appointment per 11/19 new pt. Referral. Spoke to patient who is aware of appointment date and time.

## 2020-09-25 ENCOUNTER — Inpatient Hospital Stay: Payer: PRIVATE HEALTH INSURANCE

## 2020-09-25 ENCOUNTER — Encounter: Payer: Self-pay | Admitting: Genetic Counselor

## 2020-09-25 ENCOUNTER — Inpatient Hospital Stay: Payer: PRIVATE HEALTH INSURANCE | Attending: Genetic Counselor | Admitting: Genetic Counselor

## 2020-09-25 ENCOUNTER — Other Ambulatory Visit: Payer: Self-pay

## 2020-09-25 DIAGNOSIS — Z800 Family history of malignant neoplasm of digestive organs: Secondary | ICD-10-CM

## 2020-09-25 DIAGNOSIS — Z8041 Family history of malignant neoplasm of ovary: Secondary | ICD-10-CM | POA: Diagnosis not present

## 2020-09-25 DIAGNOSIS — Z806 Family history of leukemia: Secondary | ICD-10-CM

## 2020-09-25 DIAGNOSIS — Z803 Family history of malignant neoplasm of breast: Secondary | ICD-10-CM

## 2020-09-25 HISTORY — DX: Family history of malignant neoplasm of breast: Z80.3

## 2020-09-25 HISTORY — DX: Family history of leukemia: Z80.6

## 2020-09-25 HISTORY — DX: Family history of malignant neoplasm of ovary: Z80.41

## 2020-09-25 HISTORY — DX: Family history of malignant neoplasm of digestive organs: Z80.0

## 2020-09-25 NOTE — Progress Notes (Signed)
REFERRING PROVIDER: Charyl Bigger, MD [address] [place],  [place] [address]  PRIMARY PROVIDER:  Lujean Amel, MD  PRIMARY REASON FOR VISIT:  1. Family history of breast cancer   2. Family history of pancreatic cancer   3. Family history of ovarian cancer   4. Family history of leukemia    HISTORY OF PRESENT ILLNESS:   Madison Huang, a 39 y.o. female, was seen for a [place] cancer genetics consultation at the request of Dr. Murrell Redden due to a family history of cancer.  Madison Huang presents to clinic today to discuss the possibility of a hereditary predisposition to cancer, to discuss genetic testing, and to further clarify her future cancer risks, as well as potential cancer risks for family members.   Madison Huang is a 39 y.o. female with no personal history of cancer.    RISK FACTORS:  Menarche was at age 72.  First live birth at age 89.  OCP use for approximately 11 years.  Ovaries intact: yes.  Hysterectomy: no.  Menopausal status: premenopausal.  HRT use: 0 years. Colonoscopy: no; not examined. Mammogram within the last year: yes. Number of breast biopsies: 0. Up to date with pelvic exams: yes. Any excessive radiation exposure in the past: no  Past Medical History:  Diagnosis Date   Active labor 05/04/2012   Anxiety    Asthma    as a child not since college   Breast lump    Depression    Eczema    Family history of breast cancer 09/25/2020   Family history of leukemia 09/25/2020   Family history of ovarian cancer 09/25/2020   Family history of pancreatic cancer 09/25/2020   H/O migraine with aura    H/O varicella    High blood pressure 2019   on Lisinopril   Leukocytosis 05/06/2012   Sickle cell trait ([place])    SVD 05/04/12 M 05/05/2012    No past surgical history on file.  Social History   Socioeconomic History   Marital status: Married    Spouse name: Not on file   Number of children: 1   Years of education: Not on file    Highest education level: Some college, no degree  Occupational History    Employer: [employer]  Tobacco Use   Smoking status: Never Smoker   Smokeless tobacco: Never Used  Vaping Use   Vaping Use: Never used  Substance and Sexual Activity   Alcohol use: No    Comment: quit August 2011   Drug use: Never   Sexual activity: Yes    Partners: Male    Birth control/protection: I.U.D.  Other Topics Concern   Not on file  Social History Narrative   Lives at home with husband and son   Right handed   Caffeine: 2x per month    Social Determinants of Health   Financial Resource Strain: Not on file  Food Insecurity: Not on file  Transportation Needs: Not on file  Physical Activity: Not on file  Stress: Not on file  Social Connections: Not on file     FAMILY HISTORY:  We obtained a detailed, 4-generation family history.  Significant diagnoses are listed below: Family History  Problem Relation Age of Onset   Breast cancer Mother 2   Cancer Maternal Aunt        uterine or ovarian, dx 87s   Pancreatic cancer Maternal Aunt 59   Leukemia Other        great nephew, dx  2   Breast cancer Half-Sister        paternal half sister; dx 79s    Madison Huang has one son, age 41, who does not have a history of cancer.  Madison Huang has two full siblings, one maternal half sister, two paternal half sister, and one paternal half brother.  One paternal half sister was recently diagnosed with breast cancer in her 61s.    Madison Huang mother, age 79, was diagnosed with breast cancer at age 28.  Madison Huang maternal aunt had a history of uterine or ovarian cancer in her 46s and passed away at age 69 after being diagnosed with pancreatic cancer.  No other maternal family history of cancer was reported.   Madison Huang father is 44.  Madison Huang reported no cancer in her father although he had his prostate removed.  No other paternal family history of cancer was reported.   Madison Huang is  unaware of previous family history of genetic testing for hereditary cancer risks. Patient's maternal ancestors are of African American descent, and paternal ancestors are of African American descent. There is no reported Ashkenazi Jewish ancestry. There is no known consanguinity.  GENETIC COUNSELING ASSESSMENT: Ms. Elrod is a 39 y.o. female with a family history of cancer which is somewhat suggestive of a hereditary cancer syndrome and predisposition to cancer given the related diagnoses in her maternal family. We, therefore, discussed and recommended the following at today's visit.   DISCUSSION: We discussed that 5 - 10% of cancer is hereditary, with most cases of hereditary breast cancer associated with mutations in BRCA1/2.  There are other genes that can be associated with hereditary breast, ovarian, and pancreatic cancer syndromes.  Type of cancer risk and level of risk are gene-specific.  We discussed that testing is beneficial for several reasons, including knowing about other cancer risks, identifying potential screening and risk-reduction options that may be appropriate, and to understanding if other family members could be at risk for cancer and allowing them to undergo genetic testing.  We reviewed the characteristics, features and inheritance patterns of hereditary cancer syndromes. We also discussed genetic testing, including the appropriate family members to test, the process of testing, insurance coverage and turn-around-time for results. We discussed the implications of a negative, positive, carrier and/or variant of uncertain significant result. We discussed that negative results would be uninformative given that Madison Huang does not have a personal history of cancer. We recommended Madison Huang pursue genetic testing for a panel that contains genes associated with breast, ovarian, uterine, and pancreatic cancers.  Madison Huang was offered a common hereditary cancer panel (47 genes) and an  expanded pan-cancer panel (84 genes). Madison Huang was informed of the benefits and limitations of each panel, including that expanded pan-cancer panels contain several genes that do not have clear management guidelines at this point in time.  We also discussed that as the number of genes included on a panel increases, the chances of variants of uncertain significance increases.  After considering the benefits and limitations of each gene panel, Ms. Rieth elected to have a common hereditary cancers panel through [facility].   The Common Hereditary Cancers + RNA Panel offered by [facility] includes sequencing, deletion/duplication, and RNA testing of the following 47 genes: APC, ATM, AXIN2, BARD1, BMPR1A, BRCA1, BRCA2, BRIP1, CDH1, CDK4*, CDKN2A (p14ARF)*, CDKN2A (p16INK4a)*, CHEK2, CTNNA1, DICER1, EPCAM (Deletion/duplication testing only), GREM1 (promoter region deletion/duplication testing only), KIT, MEN1, MLH1, MSH2, MSH3, MSH6, MUTYH, NBN, NF1, NHTL1, PALB2, PDGFRA*,  PMS2, POLD1, POLE, PTEN, RAD50, RAD51C, RAD51D, SDHB, SDHC, SDHD, SMAD4, SMARCA4. STK11, TP53, TSC1, TSC2, and VHL.  The following genes were evaluated for sequence changes only: SDHA and HOXB13 c.251G>A variant only.  RNA analysis is not performed for the * genes.     Based on Ms. Mcquitty family history of cancer, she meets medical criteria for genetic testing. Despite that she meets criteria, she may still have an out of pocket cost. We discussed that if her out of pocket cost for testing is over $100, the laboratory will call and confirm whether she wants to proceed with testing.  If the out of pocket cost of testing is less than $100 she will be billed by the genetic testing laboratory.   We discussed that some people do not want to undergo genetic testing due to fear of genetic discrimination.  A federal law called the Genetic Information Non-Discrimination Act (GINA) of 2008 helps protect individuals against genetic discrimination based on  their genetic test results.  It impacts both health insurance and employment.  With health insurance, it protects against increased premiums, being kicked off insurance or being forced to take a test in order to be insured.  For employment it protects against hiring, firing and promoting decisions based on genetic test results.  GINA does not apply to those in the [employer], those who work for companies with less than 15 employees, and new life insurance or long-term disability insurance policies.  Health status due to a cancer diagnosis is not protected under GINA.  PLAN: After considering the risks, benefits, and limitations, Ms. Baccari provided informed consent to pursue genetic testing and the blood sample was sent to [facility] for analysis of the Common Hereditary Cancers +RNA Panel. Results should be available within approximately 3 weeks' time, at which point they will be disclosed by telephone to Ms. Mentink, as will any additional recommendations warranted by these results. Ms. Vasbinder will receive a summary of her genetic counseling visit and a copy of her results once available. This information will also be available in Epic.   Based on Ms. Dejarnette family history, we recommended her mother, who was diagnosed with breast cancer at age 42, have genetic counseling and testing. Ms. Cutbirth will let [place] know if we can be of any assistance in coordinating genetic counseling and/or testing for this family member.   Lastly, we encouraged Ms. Begay to remain in contact with cancer genetics annually so that we can continuously update the family history and inform her of any changes in cancer genetics and testing that may be of benefit for this family.   Ms. Dorvil questions were answered to her satisfaction today. Our contact information was provided should additional questions or concerns arise. Thank you for the referral and allowing [place] to share in the care of your patient.   Abdulah Iqbal M.  Joette Catching, [place], [facility] Genetic Counselor [email or web address] (P) [phone]   The patient was seen for a total of 40 minutes in face-to-face genetic counseling.  Drs. Magrinat, Lindi Adie and/or Burr Medico were available to discuss this case as needed.  _______________________________________________________________________ For Office Staff:  Number of people involved in session: 1 Was an Intern/ student involved with case: no

## 2020-10-14 ENCOUNTER — Ambulatory Visit: Payer: Self-pay | Admitting: Genetic Counselor

## 2020-10-14 ENCOUNTER — Encounter: Payer: Self-pay | Admitting: Genetic Counselor

## 2020-10-14 ENCOUNTER — Telehealth: Payer: Self-pay | Admitting: Genetic Counselor

## 2020-10-14 DIAGNOSIS — Z806 Family history of leukemia: Secondary | ICD-10-CM

## 2020-10-14 DIAGNOSIS — Z803 Family history of malignant neoplasm of breast: Secondary | ICD-10-CM

## 2020-10-14 DIAGNOSIS — Z800 Family history of malignant neoplasm of digestive organs: Secondary | ICD-10-CM

## 2020-10-14 DIAGNOSIS — Z1379 Encounter for other screening for genetic and chromosomal anomalies: Secondary | ICD-10-CM

## 2020-10-14 DIAGNOSIS — Z8041 Family history of malignant neoplasm of ovary: Secondary | ICD-10-CM

## 2020-10-14 NOTE — Progress Notes (Signed)
HPI:  Ms. Ertle was previously seen in the [place] clinic due to a family history of cancer and concerns regarding a hereditary predisposition to cancer. Please refer to our prior cancer genetics clinic note for more information regarding our discussion, assessment and recommendations, at the time. Ms. Luo recent genetic test results were disclosed to her, as were recommendations warranted by these results. These results and recommendations are discussed in more detail below.  CANCER HISTORY:  Oncology History   No history exists.   Ms. Dsouza is a 40 y.o. female with no personal history of cancer.   FAMILY HISTORY:  We obtained a detailed, 4-generation family history.  Significant diagnoses are listed below: Family History  Problem Relation Age of Onset  . Breast cancer Mother 4  . Cancer Maternal Aunt        uterine or ovarian, dx 12s  . Pancreatic cancer Maternal Aunt 59  . Leukemia Other        great nephew, dx 2  . Breast cancer Half-Sister        paternal half sister; dx 86s    Ms. Helder has one son, age 29, who does not have a history of cancer.  Ms. Cregan has two full siblings, one maternal half sister, two paternal half sister, and one paternal half brother.  One paternal half sister was recently diagnosed with breast cancer in her 34s.    Ms. Brents mother, age 68, was diagnosed with breast cancer at age 13.  Ms. Vancamp maternal aunt had a history of uterine or ovarian cancer in her 56s and passed away at age 50 after being diagnosed with pancreatic cancer.  No other maternal family history of cancer was reported.   Ms. Podgurski father is 29.  Ms. Andreason reported no cancer in her father although he had his prostate removed.  No other paternal family history of cancer was reported.   Ms. Coverdale is unaware of previous family history of genetic testing for hereditary cancer risks. Patient's maternal ancestors are of African American  descent, and paternal ancestors are of African American descent. There is no reported Ashkenazi Jewish ancestry. There is no known consanguinity.  GENETIC TEST RESULTS: Genetic testing reported out on October 12, 2020.  The [facility] Common Hereditary Cancers +RNA Panel found no pathogenic mutations.  The Common Hereditary Cancers + RNA Panel offered by [facility] includes sequencing, deletion/duplication, and RNA testing of the following 47 genes: APC, ATM, AXIN2, BARD1, BMPR1A, BRCA1, BRCA2, BRIP1, CDH1, CDK4*, CDKN2A (p14ARF)*, CDKN2A (p16INK4a)*, CHEK2, CTNNA1, DICER1, EPCAM (Deletion/duplication testing only), GREM1 (promoter region deletion/duplication testing only), KIT, MEN1, MLH1, MSH2, MSH3, MSH6, MUTYH, NBN, NF1, NHTL1, PALB2, PDGFRA*, PMS2, POLD1, POLE, PTEN, RAD50, RAD51C, RAD51D, SDHB, SDHC, SDHD, SMAD4, SMARCA4. STK11, TP53, TSC1, TSC2, and VHL.  The following genes were evaluated for sequence changes only: SDHA and HOXB13 c.251G>A variant only.  RNA analysis is not performed for the * genes.  The test report has been scanned into EPIC and is located under the Molecular Pathology section of the Results Review tab.  A portion of the result report is included below for reference.     We discussed with Ms. Verbeek that because current genetic testing is not perfect, it is possible there may be a gene mutation in one of these genes that current testing cannot detect, but that chance is small.  We also discussed, that there could be another gene that has not yet been discovered, or that we have not  yet tested, that is responsible for the cancer diagnoses in the family. It is also possible there is a hereditary cause for the cancer in the family that Ms. Tweten did not inherit and therefore was not identified in her testing.  Therefore, it is important to remain in touch with cancer genetics in the future so that we can continue to offer Ms. Gwinn the most up to date genetic testing.   ADDITIONAL  GENETIC TESTING: We discussed with Ms. Boateng that there are other genes that are associated with increased cancer risk that can be analyzed. Should Ms. Adcox wish to pursue additional genetic testing, we are happy to discuss and coordinate this testing, at any time.      CANCER SCREENING RECOMMENDATIONS: Ms. Pumphrey test result is considered negative (normal).  This means that we have not identified a hereditary cause for her family history of cancer at this time. Most cancers happen by chance and this negative test suggests that herfamily's cancer may fall into this category.    While reassuring, this does not definitively rule out a hereditary predisposition to cancer. It is still possible that there could be genetic mutations that are undetectable by current technology. There could be genetic mutations in genes that have not been tested or identified to increase cancer risk.  Therefore, it is recommended she continue to follow the cancer management and screening guidelines provided by herprimary healthcare provider.   An individual's cancer risk and medical management are not determined by genetic test results alone. Overall cancer risk assessment incorporates additional factors, including personal medical history, family history, and any available genetic information that may result in a personalized plan for cancer prevention and surveillance  Based on Ms. Gift family of cancer, as well as her genetic test results, statistical models Harriett Rush)  and literature data were used to estimate her risk of developing breast cancer. These estimate her lifetime risk of developing breast cancer to be approximately 24%.  The patient's lifetime breast cancer risk is a preliminary estimate based on available information using one of several models endorsed by the [employer] (ACS). The ACS recommends consideration of breast MRI screening as an adjunct to mammography for patients at high  risk (defined as 20% or greater lifetime risk).      We discussed that Ms. Schlauch should discuss her individual situation with her referring physician and determine a breast cancer screening plan with which they are both comfortable.  We also discussed that it is reasonable for Ms. Nardelli to be followed by a high-risk breast cancer clinic; in addition to a yearly mammogram and physical exam by a healthcare provider, she stated that she plans discuss the usefulness of an annual breast MRI with the high-risk clinic providers.  RECOMMENDATIONS FOR FAMILY MEMBERS:  Individuals in this family might be at some increased risk of developing cancer, over the general population risk, simply due to the family history of cancer.  We recommended women in this family have a yearly mammogram beginning at age 39, or 20 years younger than the earliest onset of cancer, an annual clinical breast exam, and perform monthly breast self-exams. Women in this family should also have a gynecological exam as recommended by their primary provider. All family members should be referred for colonoscopy starting at age 60.  It is also possible there is a hereditary cause for the cancer in Ms. Caporaso family that she did not inherit and therefore was not identified in her.  Based on  Ms. Mccarn family history, we recommended her mother, who was diagnosed with breast cancer at age 6, have genetic counseling and testing. Ms. Cragin will let us know if we can be of any assistance in coordinating genetic counseling and/or testing for this family member.   FOLLOW-UP: Lastly, we discussed with Ms. Slatten that cancer genetics is a rapidly advancing field and it is possible that new genetic tests will be appropriate for her and/or her family members in the future. We encouraged her to remain in contact with cancer genetics on an annual basis so we can update her personal and family histories and let her know of advances in cancer  genetics that may benefit this family.   Our contact number was provided. Ms. Dark questions were answered to her satisfaction, and she knows she is welcome to call us at anytime with additional questions or concerns.   Isaah Furry M. Joette Catching, [place], [facility] Genetic Counselor [email or web address] (P) [phone]

## 2020-10-14 NOTE — Telephone Encounter (Signed)
Revealed negative genetic testing.  Discussed that we do not know why there is cancer in the family. It could be sporadic, due to a change in a gene she did not inherit, due to a different gene that we are not testing, or maybe our current technology may not be able to pick something up.  It will be important for her to keep in contact with genetics to keep up with whether additional testing may be needed.   

## 2020-10-17 ENCOUNTER — Telehealth: Payer: Self-pay | Admitting: Hematology and Oncology

## 2020-10-17 NOTE — Telephone Encounter (Signed)
Received a referral from Cari for Madison Huang to be seen in the high risk clinic. Pt has been cld and scheduled to see Dr. Al Pimple on 1/28 at 2pm. Pt aware to arrive 20 minutes early.

## 2020-10-23 ENCOUNTER — Telehealth: Payer: Self-pay | Admitting: Hematology and Oncology

## 2020-10-23 NOTE — Telephone Encounter (Signed)
Rescheduled appointment per MD schedule change. Called patient, no answer. Left message with updated appointment date and time.  

## 2020-10-28 ENCOUNTER — Telehealth: Payer: Self-pay | Admitting: Hematology and Oncology

## 2020-10-28 NOTE — Telephone Encounter (Signed)
Rescheduled appointment per MD schedule change. Called patient, no answer. Left message with updated appointment time.

## 2020-11-04 ENCOUNTER — Telehealth: Payer: Self-pay | Admitting: Hematology and Oncology

## 2020-11-04 NOTE — Telephone Encounter (Signed)
Pt cld wanting cxl her high risk appt that was scheduled on 1/27. Sh edidn't want to reschedule

## 2020-11-06 ENCOUNTER — Encounter: Payer: PRIVATE HEALTH INSURANCE | Admitting: Hematology and Oncology

## 2020-11-07 ENCOUNTER — Encounter: Payer: PRIVATE HEALTH INSURANCE | Admitting: Hematology and Oncology

## 2021-07-09 ENCOUNTER — Other Ambulatory Visit: Payer: Self-pay | Admitting: Physician Assistant

## 2021-07-09 DIAGNOSIS — R1010 Upper abdominal pain, unspecified: Secondary | ICD-10-CM

## 2021-07-16 ENCOUNTER — Ambulatory Visit
Admission: RE | Admit: 2021-07-16 | Discharge: 2021-07-16 | Disposition: A | Discharge status: Home / Self Care | Payer: PRIVATE HEALTH INSURANCE | Source: Ambulatory Visit | Attending: Physician Assistant | Admitting: Physician Assistant

## 2021-07-16 DIAGNOSIS — R1010 Upper abdominal pain, unspecified: Secondary | ICD-10-CM

## 2022-03-17 IMAGING — US US ABDOMEN COMPLETE
1 series · 14 of 25 positions shown · non-contrast
Comparison: None.

CLINICAL DATA: Upper abdominal pain

EXAM:
ABDOMEN ULTRASOUND COMPLETE

[Series 1: us abdomen complete · 0.15mm/px · 14 of 85 slices shown]
[im 1/85]
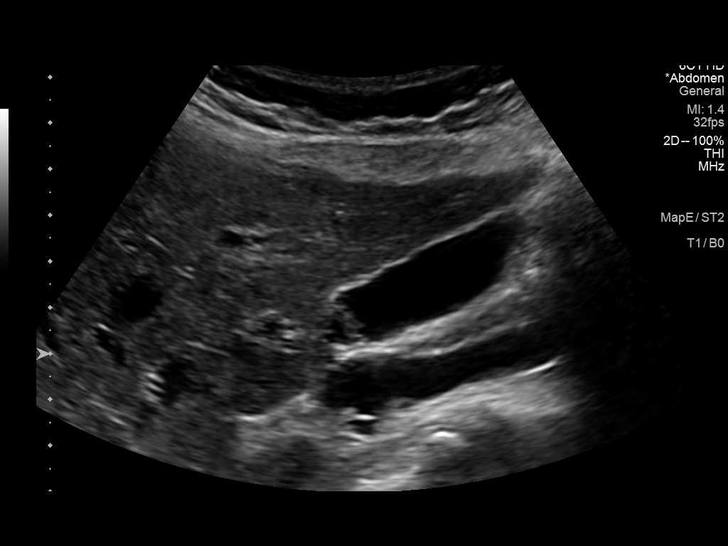
[im 8/85]
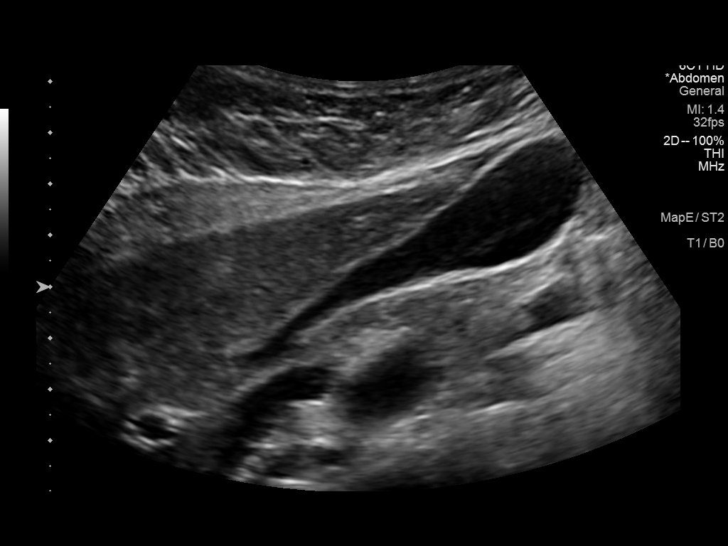
[im 15/85]
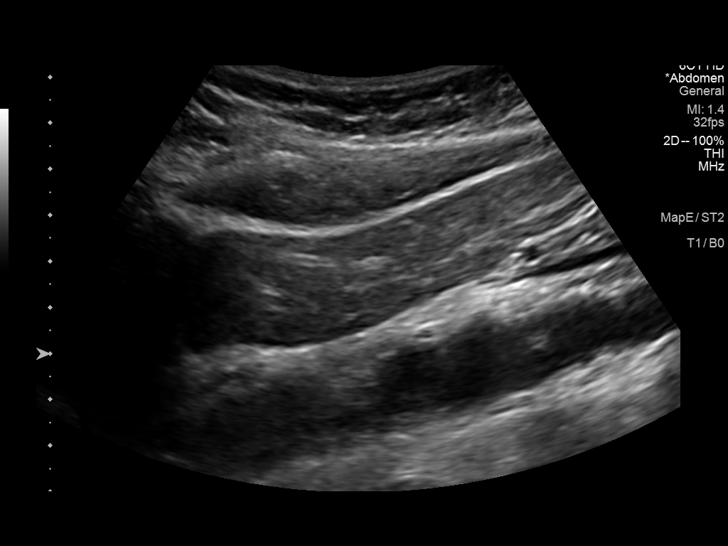
[im 22/85]
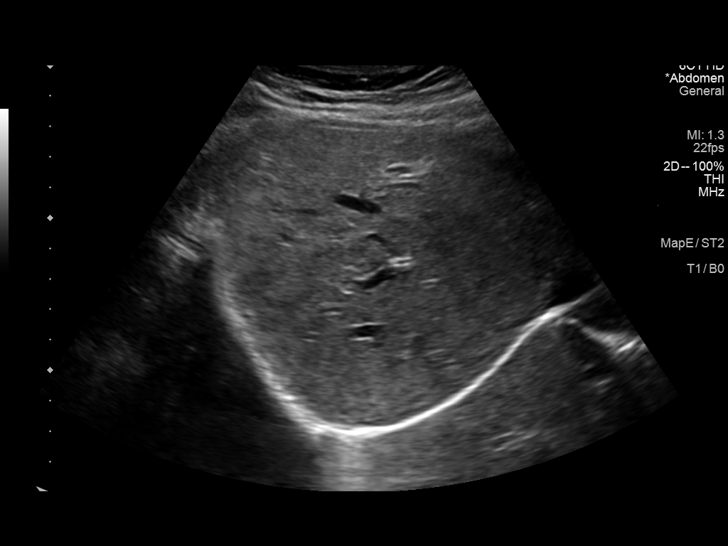
[im 29/85]
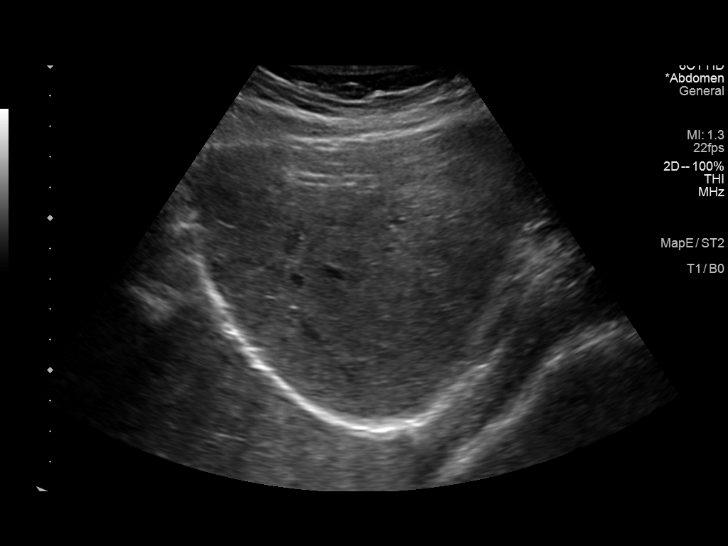
[im 32/85]
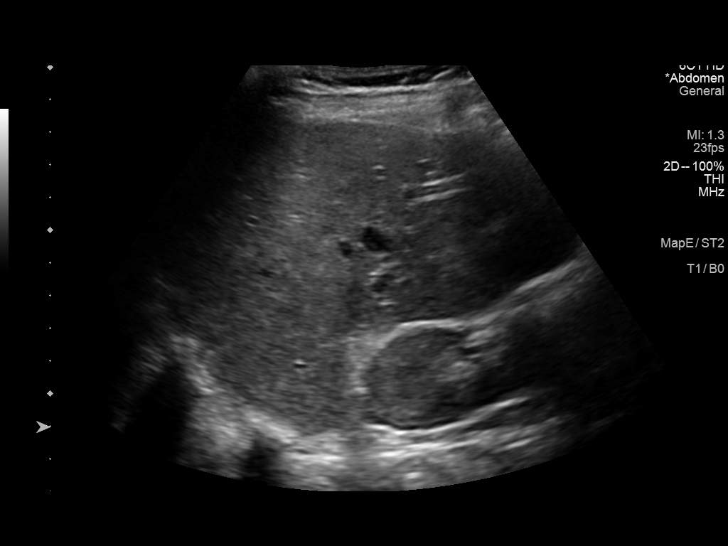
[im 39/85]
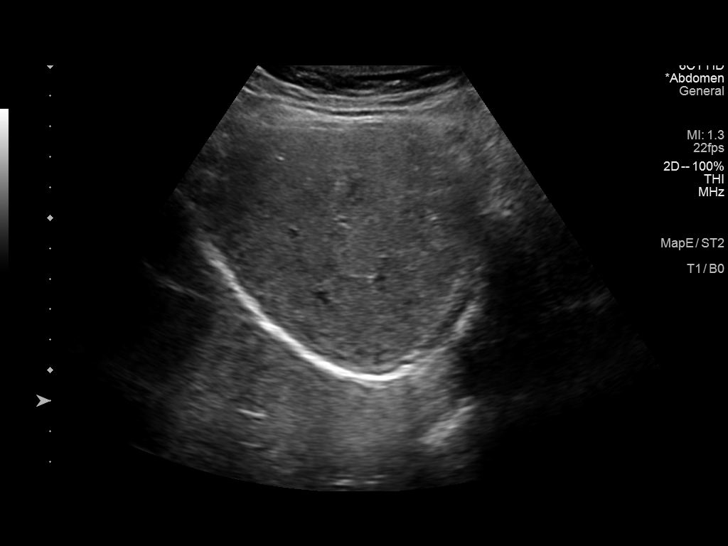
[im 46/85]
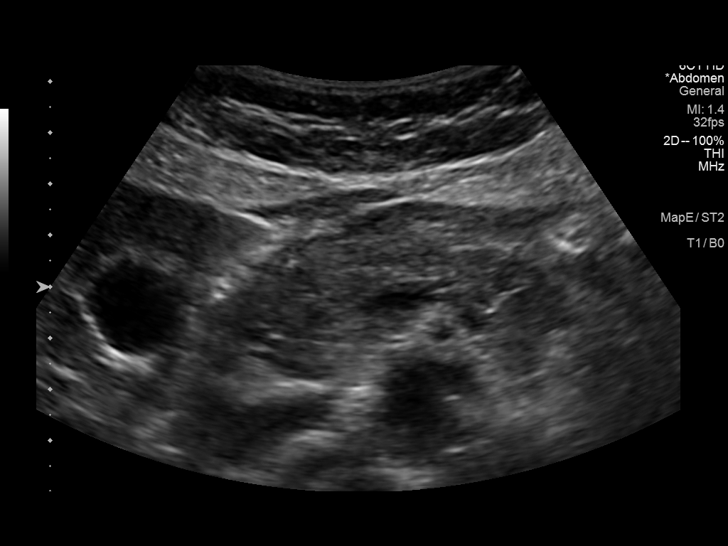
[im 53/85]
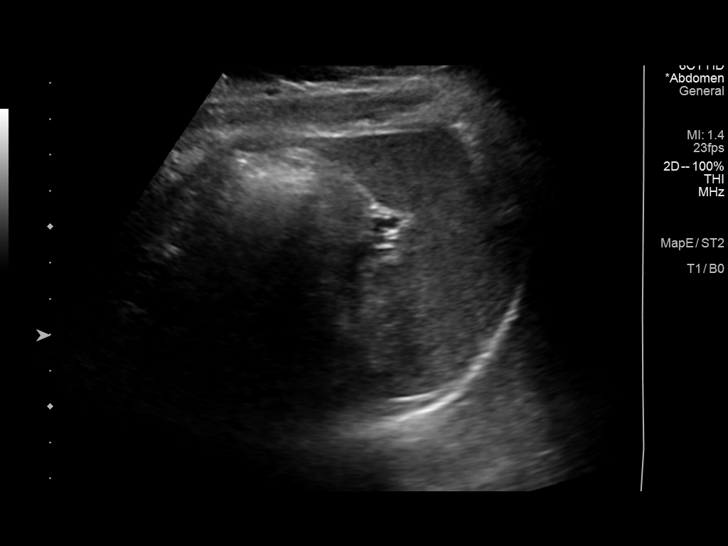
[im 57/85]
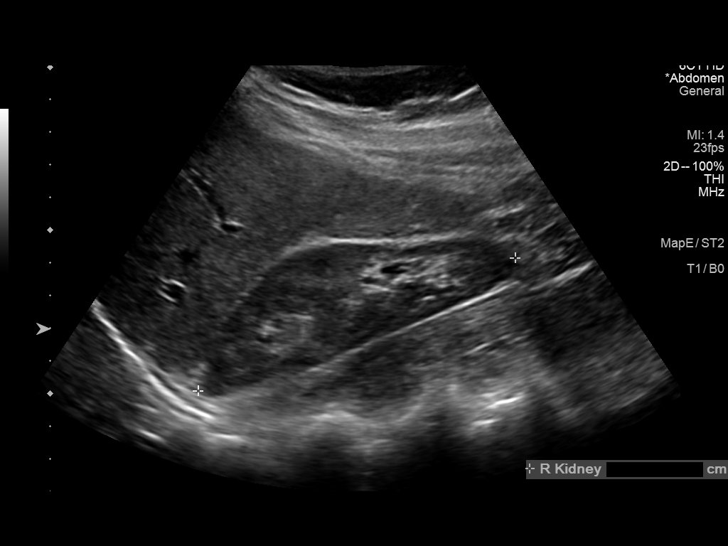
[im 64/85]
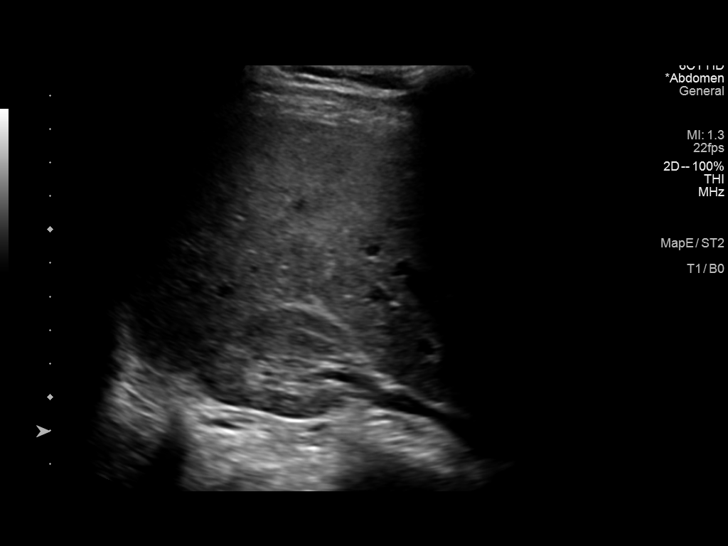
[im 71/85]
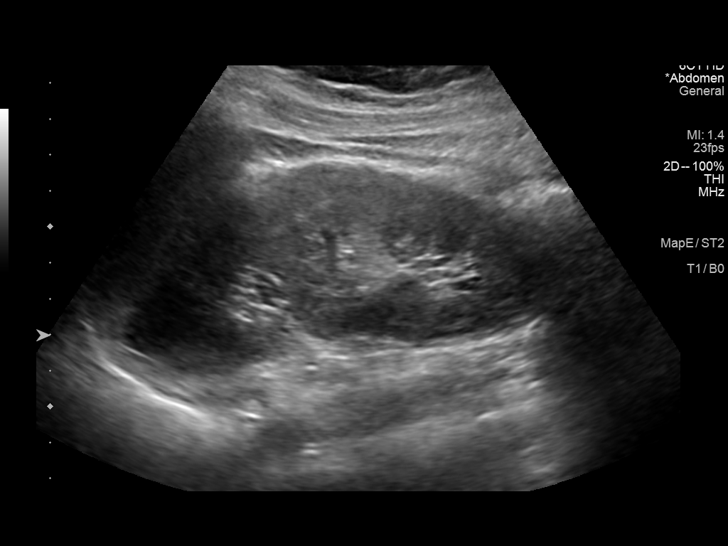
[im 78/85]
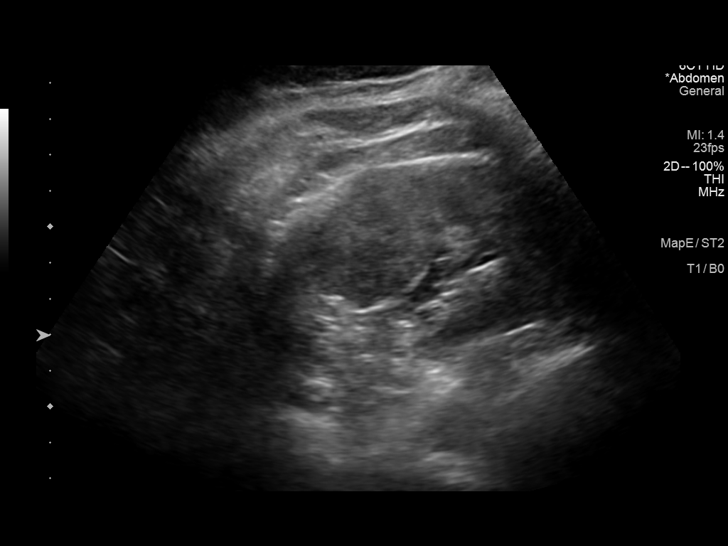
[im 85/85]
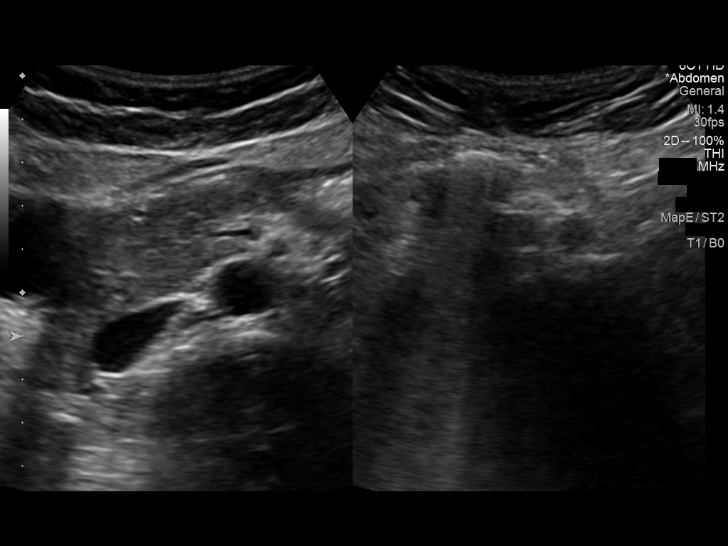

[14 of 25 positions shown; findings below may reference images not displayed]

FINDINGS: Gallbladder: No gallstones or wall thickening visualized. No
sonographic Murphy sign noted by sonographer.

Common bile duct: Diameter: 2 mm

Liver: No focal lesion identified. Within normal limits in
parenchymal echogenicity. Portal vein is patent on color Doppler
imaging with normal direction of blood flow towards the liver.

IVC: No abnormality visualized.

Pancreas: Visualized portion unremarkable.

Spleen: Size and appearance within normal limits.

Right Kidney: Length: 10.5 cm. Echogenicity within normal limits. No
mass or hydronephrosis visualized.

Left Kidney: Length: 11.3 cm. Echogenicity within normal limits. No
mass or hydronephrosis visualized.

Abdominal aorta: No aneurysm visualized.

Other findings: None.
IMPRESSION: Normal abdominal ultrasound.

## 2024-08-30 ENCOUNTER — Other Ambulatory Visit: Payer: Self-pay | Admitting: Obstetrics and Gynecology

## 2024-08-30 DIAGNOSIS — Z803 Family history of malignant neoplasm of breast: Secondary | ICD-10-CM
